# Patient Record
Sex: Female | Born: 1950 | Race: Black or African American | Hispanic: No | Smoking: Former smoker
Health system: Southern US, Community
[De-identification: ages and names within clinical notes are randomized; demographics above are authoritative.]

## PROBLEM LIST (undated history)

## (undated) DIAGNOSIS — I10 Essential (primary) hypertension: Secondary | ICD-10-CM

## (undated) DIAGNOSIS — M1990 Unspecified osteoarthritis, unspecified site: Secondary | ICD-10-CM

## (undated) DIAGNOSIS — R7303 Prediabetes: Secondary | ICD-10-CM

## (undated) DIAGNOSIS — I639 Cerebral infarction, unspecified: Secondary | ICD-10-CM

## (undated) DIAGNOSIS — E119 Type 2 diabetes mellitus without complications: Secondary | ICD-10-CM

## (undated) HISTORY — PX: EYE SURGERY: SHX253

## (undated) HISTORY — PX: ABDOMINAL HYSTERECTOMY: SHX81

---
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
## 2006-09-17 ENCOUNTER — Emergency Department: Payer: Self-pay | Admitting: Emergency Medicine

## 2008-12-20 ENCOUNTER — Ambulatory Visit: Payer: Self-pay | Admitting: Family Medicine

## 2009-02-08 ENCOUNTER — Ambulatory Visit: Payer: Self-pay | Admitting: Gastroenterology

## 2012-04-28 ENCOUNTER — Ambulatory Visit: Payer: Self-pay | Admitting: Family Medicine

## 2013-05-11 ENCOUNTER — Ambulatory Visit: Payer: Self-pay | Admitting: Family Medicine

## 2014-01-05 ENCOUNTER — Ambulatory Visit: Payer: Self-pay | Admitting: Family Medicine

## 2014-01-07 ENCOUNTER — Ambulatory Visit: Payer: Self-pay | Admitting: Family Medicine

## 2014-02-07 ENCOUNTER — Ambulatory Visit: Payer: Self-pay | Admitting: Family Medicine

## 2016-02-20 ENCOUNTER — Other Ambulatory Visit: Payer: Self-pay | Admitting: Family Medicine

## 2016-02-20 DIAGNOSIS — Z1231 Encounter for screening mammogram for malignant neoplasm of breast: Secondary | ICD-10-CM

## 2016-03-07 ENCOUNTER — Ambulatory Visit
Admission: RE | Admit: 2016-03-07 | Discharge: 2016-03-07 | Disposition: A | Discharge status: Home / Self Care | Payer: BC Managed Care – PPO | Source: Ambulatory Visit | Attending: Family Medicine | Admitting: Family Medicine

## 2016-03-07 DIAGNOSIS — Z1231 Encounter for screening mammogram for malignant neoplasm of breast: Secondary | ICD-10-CM | POA: Diagnosis present

## 2017-09-25 ENCOUNTER — Other Ambulatory Visit: Payer: Self-pay | Admitting: Student

## 2017-09-25 DIAGNOSIS — M25562 Pain in left knee: Secondary | ICD-10-CM

## 2017-10-02 ENCOUNTER — Ambulatory Visit
Admission: RE | Admit: 2017-10-02 | Discharge: 2017-10-02 | Disposition: A | Discharge status: Home / Self Care | Payer: BC Managed Care – PPO | Source: Ambulatory Visit | Attending: Student | Admitting: Student

## 2017-10-02 DIAGNOSIS — M1712 Unilateral primary osteoarthritis, left knee: Secondary | ICD-10-CM | POA: Insufficient documentation

## 2017-10-02 DIAGNOSIS — M25562 Pain in left knee: Secondary | ICD-10-CM

## 2017-10-09 ENCOUNTER — Other Ambulatory Visit: Payer: Self-pay | Admitting: Family Medicine

## 2017-10-09 DIAGNOSIS — Z1231 Encounter for screening mammogram for malignant neoplasm of breast: Secondary | ICD-10-CM

## 2017-10-15 ENCOUNTER — Ambulatory Visit
Admission: RE | Admit: 2017-10-15 | Discharge: 2017-10-15 | Disposition: A | Discharge status: Home / Self Care | Payer: Medicare Other | Source: Ambulatory Visit | Attending: Family Medicine | Admitting: Family Medicine

## 2017-10-15 DIAGNOSIS — Z1231 Encounter for screening mammogram for malignant neoplasm of breast: Secondary | ICD-10-CM | POA: Diagnosis present

## 2017-10-22 ENCOUNTER — Encounter
Admission: RE | Admit: 2017-10-22 | Discharge: 2017-10-22 | Disposition: A | Discharge status: Home / Self Care | Payer: Medicare Other | Source: Ambulatory Visit | Attending: Surgery | Admitting: Surgery

## 2017-10-22 ENCOUNTER — Other Ambulatory Visit: Payer: Self-pay

## 2017-10-22 ENCOUNTER — Ambulatory Visit
Admission: RE | Admit: 2017-10-22 | Discharge: 2017-10-22 | Disposition: A | Discharge status: Home / Self Care | Payer: Medicare Other | Source: Ambulatory Visit | Attending: Surgery | Admitting: Surgery

## 2017-10-22 DIAGNOSIS — Z01810 Encounter for preprocedural cardiovascular examination: Secondary | ICD-10-CM | POA: Insufficient documentation

## 2017-10-22 DIAGNOSIS — Z01818 Encounter for other preprocedural examination: Secondary | ICD-10-CM | POA: Diagnosis present

## 2017-10-22 DIAGNOSIS — Z01812 Encounter for preprocedural laboratory examination: Secondary | ICD-10-CM | POA: Insufficient documentation

## 2017-10-22 HISTORY — DX: Cerebral infarction, unspecified: I63.9

## 2017-10-22 HISTORY — DX: Unspecified osteoarthritis, unspecified site: M19.90

## 2017-10-22 HISTORY — DX: Essential (primary) hypertension: I10

## 2017-10-22 LAB — PROTIME-INR
INR: 0.98
PROTHROMBIN TIME: 12.9 s (ref 11.4–15.2)

## 2017-10-22 LAB — CBC
HCT: 36.9 % (ref 35.0–47.0)
HEMOGLOBIN: 12.5 g/dL (ref 12.0–16.0)
MCH: 27.7 pg (ref 26.0–34.0)
MCHC: 33.7 g/dL (ref 32.0–36.0)
MCV: 82.1 fL (ref 80.0–100.0)
Platelets: 251 10*3/uL (ref 150–440)
RBC: 4.5 MIL/uL (ref 3.80–5.20)
RDW: 14.5 % (ref 11.5–14.5)
WBC: 5.9 10*3/uL (ref 3.6–11.0)

## 2017-10-22 LAB — TYPE AND SCREEN
ABO/RH(D): B POS
ANTIBODY SCREEN: NEGATIVE

## 2017-10-22 LAB — SURGICAL PCR SCREEN
MRSA, PCR: NEGATIVE
STAPHYLOCOCCUS AUREUS: NEGATIVE

## 2017-10-22 LAB — BASIC METABOLIC PANEL
Anion gap: 9 (ref 5–15)
BUN: 11 mg/dL (ref 6–20)
CHLORIDE: 107 mmol/L (ref 101–111)
CO2: 26 mmol/L (ref 22–32)
Calcium: 9.1 mg/dL (ref 8.9–10.3)
Creatinine, Ser: 0.78 mg/dL (ref 0.44–1.00)
GFR calc Af Amer: >60 mL/min (ref >60)
GFR calc non Af Amer: >60 mL/min (ref >60)
GLUCOSE: 125 mg/dL — AB (ref 65–99)
POTASSIUM: 3.4 mmol/L — AB (ref 3.5–5.1)
Sodium: 142 mmol/L (ref 135–145)

## 2017-10-22 LAB — URINALYSIS, COMPLETE (UACMP) WITH MICROSCOPIC
BACTERIA UA: NONE SEEN
BILIRUBIN URINE: NEGATIVE
Glucose, UA: NEGATIVE mg/dL
HGB URINE DIPSTICK: NEGATIVE
KETONES UR: NEGATIVE mg/dL
Nitrite: NEGATIVE
PROTEIN: NEGATIVE mg/dL
SPECIFIC GRAVITY, URINE: 1.024 (ref 1.005–1.030)
pH: 5 (ref 5.0–8.0)

## 2017-10-22 NOTE — Patient Instructions (Signed)
Your procedure is scheduled on: Thursday October 30, 2017  Report to Same Day Surgery on the 2nd floor [facility] the [facility]. To find out your arrival time, please call [phone] between 1PM - 3PM on: Wednesday October 29, 2017  REMEMBER: Instructions that are not followed completely may result in serious medical risk, up to and including death; or upon the discretion of your surgeon and anesthesiologist your surgery may need to be rescheduled.  Do not eat food after midnight the night before your procedure.  No gum chewing or hard candies.  You may however, drink CLEAR liquids up to 2 hours before you are scheduled to arrive at the hospital for your procedure.  Do not drink clear liquids within 2 hours of the start of your surgery.  Clear liquids include: - water  - apple juice without pulp - clear gatorade - black coffee or tea (Do NOT add anything to the coffee or tea) Do NOT drink anything that is not on this list.  No Smoking including e-cigarettes for 24 hours prior to surgery. No chewable tobacco products for at least 6 hours prior to surgery. No nicotine patches on the day of surgery.  On the morning of surgery brush your teeth with toothpaste and water, you may rinse your mouth with mouthwash if you wish. Do not swallow any  toothpaste OR mouthwash.  Notify your doctor if there is any change in your medical condition (cold, fever, infection).  Do not wear jewelry, make-up, hairpins, clips or nail polish.  Do not wear lotions, powders, or perfumes. You may NOT wear deodorant.  Do not shave 48 hours prior to surgery. Men may shave face and neck.  Contacts and dentures may not be worn into surgery.  Do not bring valuables to the hospital. [facility] any belongings or valuables.   TAKE THESE MEDICATIONS THE MORNING OF SURGERY WITH A SIP OF WATER: CARVEDILOL   Use CHG Soap or wipes as directed on instruction sheet.  Follow recommendations  from Cardiologist, Pulmonologist or PCP regarding stopping Aspirin, Coumadin, Plavix, Eliquis, Pradaxa, or Pletal.  Stop Anti-inflammatories such as Advil, Aleve, Ibuprofen, Motrin, Naproxen, Naprosyn, Goodie powder, MELOXICAM  or aspirin products. (May take Tylenol or Acetaminophen if needed.)  Stop ANY OVER THE COUNTER supplements until after surgery. (May continue Vitamin D, Vitamin B, and multivitamin.)  If you are being admitted to the hospital overnight, leave your suitcase in the car. After surgery it may be brought to your room.  If you are being discharged the day of surgery, you will not be allowed to drive home. You will need someone to drive you home and stay with you that night.   If you are taking public transportation, you will need to have a responsible adult to with you.  Please call the number above if you have any questions about these instructions.

## 2017-10-22 NOTE — Pre-Procedure Instructions (Signed)
Abnormal urinalysis result sent to Dr Binnie RailPoggi's office

## 2017-10-24 ENCOUNTER — Other Ambulatory Visit: Payer: Medicare Other

## 2017-10-27 ENCOUNTER — Encounter
Admission: RE | Admit: 2017-10-27 | Discharge: 2017-10-27 | Disposition: A | Discharge status: Home / Self Care | Payer: Medicare Other | Source: Ambulatory Visit | Attending: Surgery | Admitting: Surgery

## 2017-10-27 LAB — URINALYSIS, ROUTINE W REFLEX MICROSCOPIC
BILIRUBIN URINE: NEGATIVE
Glucose, UA: NEGATIVE mg/dL
HGB URINE DIPSTICK: NEGATIVE
Ketones, ur: NEGATIVE mg/dL
NITRITE: NEGATIVE
PROTEIN: NEGATIVE mg/dL
Specific Gravity, Urine: 1.01 (ref 1.005–1.030)
pH: 7 (ref 5.0–8.0)

## 2017-10-27 NOTE — Pre-Procedure Instructions (Signed)
Repeat urine collected, sent to lab.

## 2017-10-29 MED ORDER — CEFAZOLIN SODIUM-DEXTROSE 2-4 GM/100ML-% IV SOLN: 2.0000 g | Freq: Once | INTRAVENOUS | Status: DC

## 2017-10-30 ENCOUNTER — Inpatient Hospital Stay
Admission: RE | Admit: 2017-10-30 | Discharge: 2017-11-01 | DRG: 470 | Disposition: A | Discharge status: Home Health | Payer: Medicare Other | Source: Ambulatory Visit | Attending: Surgery | Admitting: Surgery

## 2017-10-30 ENCOUNTER — Inpatient Hospital Stay: Payer: Medicare Other | Admitting: Anesthesiology

## 2017-10-30 ENCOUNTER — Encounter: Payer: Self-pay | Admitting: *Deleted

## 2017-10-30 ENCOUNTER — Encounter
Admission: RE | Disposition: A | Discharge status: Home Health | Payer: Self-pay | Source: Ambulatory Visit | Attending: Surgery

## 2017-10-30 ENCOUNTER — Other Ambulatory Visit: Payer: Self-pay

## 2017-10-30 ENCOUNTER — Inpatient Hospital Stay: Payer: Medicare Other

## 2017-10-30 DIAGNOSIS — M1612 Unilateral primary osteoarthritis, left hip: Principal | ICD-10-CM | POA: Diagnosis present

## 2017-10-30 DIAGNOSIS — Z79899 Other long term (current) drug therapy: Secondary | ICD-10-CM | POA: Diagnosis not present

## 2017-10-30 DIAGNOSIS — Z7982 Long term (current) use of aspirin: Secondary | ICD-10-CM | POA: Diagnosis not present

## 2017-10-30 DIAGNOSIS — R509 Fever, unspecified: Secondary | ICD-10-CM | POA: Diagnosis not present

## 2017-10-30 DIAGNOSIS — Z87891 Personal history of nicotine dependence: Secondary | ICD-10-CM

## 2017-10-30 DIAGNOSIS — Z791 Long term (current) use of non-steroidal anti-inflammatories (NSAID): Secondary | ICD-10-CM

## 2017-10-30 DIAGNOSIS — Z96642 Presence of left artificial hip joint: Secondary | ICD-10-CM

## 2017-10-30 DIAGNOSIS — I69398 Other sequelae of cerebral infarction: Secondary | ICD-10-CM

## 2017-10-30 DIAGNOSIS — I10 Essential (primary) hypertension: Secondary | ICD-10-CM | POA: Diagnosis present

## 2017-10-30 DIAGNOSIS — I38 Endocarditis, valve unspecified: Secondary | ICD-10-CM | POA: Diagnosis present

## 2017-10-30 HISTORY — PX: TOTAL HIP ARTHROPLASTY: SHX124

## 2017-10-30 LAB — ABO/RH: ABO/RH(D): B POS

## 2017-10-30 LAB — GLUCOSE, CAPILLARY: GLUCOSE-CAPILLARY: 125 mg/dL — AB (ref 65–99)

## 2017-10-30 SURGERY — ARTHROPLASTY, HIP, TOTAL,POSTERIOR APPROACH
Anesthesia: Spinal | Site: Hip | Laterality: Left | Wound class: Clean

## 2017-10-30 MED ORDER — PANTOPRAZOLE SODIUM 40 MG PO TBEC
40.0000 mg | DELAYED_RELEASE_TABLET | Freq: Every day | ORAL | Status: DC
  Administered 2017-10-30 – 2017-11-01 (×3): 40 mg via ORAL
  Filled 2017-10-30 (×2): qty 1

## 2017-10-30 MED ORDER — NEOMYCIN-POLYMYXIN B GU 40-200000 IR SOLN
Status: DC | PRN
  Administered 2017-10-30: 14 mL

## 2017-10-30 MED ORDER — BUPIVACAINE LIPOSOME 1.3 % IJ SUSP
INTRAMUSCULAR | Status: AC
  Filled 2017-10-30: qty 20

## 2017-10-30 MED ORDER — DIPHENHYDRAMINE HCL 12.5 MG/5ML PO ELIX: 12.5000 mg | ORAL_SOLUTION | ORAL | Status: DC | PRN

## 2017-10-30 MED ORDER — HYDROCODONE-ACETAMINOPHEN 7.5-325 MG PO TABS: 1.0000 | ORAL_TABLET | Freq: Once | ORAL | Status: DC | PRN

## 2017-10-30 MED ORDER — ACETAMINOPHEN 500 MG PO TABS
1000.0000 mg | ORAL_TABLET | Freq: Four times a day (QID) | ORAL | Status: AC
  Administered 2017-10-30 – 2017-10-31 (×4): 1000 mg via ORAL
  Filled 2017-10-30 (×4): qty 2

## 2017-10-30 MED ORDER — OXYCODONE HCL 5 MG PO TABS
10.0000 mg | ORAL_TABLET | ORAL | Status: DC | PRN
  Administered 2017-10-31: 10 mg via ORAL
  Filled 2017-10-30: qty 2

## 2017-10-30 MED ORDER — DOCUSATE SODIUM 100 MG PO CAPS
100.0000 mg | ORAL_CAPSULE | Freq: Two times a day (BID) | ORAL | Status: DC
  Administered 2017-10-30 – 2017-11-01 (×4): 100 mg via ORAL
  Filled 2017-10-30 (×4): qty 1

## 2017-10-30 MED ORDER — SODIUM CHLORIDE 0.9 % IV SOLN
INTRAVENOUS | Status: DC | PRN
  Administered 2017-10-30: 60 mL

## 2017-10-30 MED ORDER — CARVEDILOL 3.125 MG PO TABS
3.1250 mg | ORAL_TABLET | Freq: Two times a day (BID) | ORAL | Status: DC
  Administered 2017-10-30 – 2017-11-01 (×4): 3.125 mg via ORAL
  Filled 2017-10-30 (×4): qty 1

## 2017-10-30 MED ORDER — CEFAZOLIN SODIUM-DEXTROSE 2-4 GM/100ML-% IV SOLN
INTRAVENOUS | Status: AC
  Filled 2017-10-30: qty 100

## 2017-10-30 MED ORDER — PHENYLEPHRINE HCL 10 MG/ML IJ SOLN
INTRAVENOUS | Status: DC | PRN
  Administered 2017-10-30: 20 ug/min via INTRAVENOUS

## 2017-10-30 MED ORDER — PHENYLEPHRINE HCL 10 MG/ML IJ SOLN
INTRAMUSCULAR | Status: AC
  Filled 2017-10-30: qty 1

## 2017-10-30 MED ORDER — MORPHINE SULFATE (PF) 2 MG/ML IV SOLN: 2.0000 mg | INTRAVENOUS | Status: DC | PRN

## 2017-10-30 MED ORDER — OXYCODONE HCL 5 MG PO TABS
5.0000 mg | ORAL_TABLET | ORAL | Status: DC | PRN
  Administered 2017-10-30 – 2017-10-31 (×4): 5 mg via ORAL
  Filled 2017-10-30 (×4): qty 1

## 2017-10-30 MED ORDER — FENTANYL CITRATE (PF) 100 MCG/2ML IJ SOLN
INTRAMUSCULAR | Status: AC
  Filled 2017-10-30: qty 2

## 2017-10-30 MED ORDER — AMLODIPINE BESYLATE 10 MG PO TABS
10.0000 mg | ORAL_TABLET | Freq: Every evening | ORAL | Status: DC
  Administered 2017-10-30 – 2017-11-01 (×2): 10 mg via ORAL
  Filled 2017-10-30 (×2): qty 1

## 2017-10-30 MED ORDER — METOCLOPRAMIDE HCL 5 MG/ML IJ SOLN: 5.0000 mg | Freq: Three times a day (TID) | INTRAMUSCULAR | Status: DC | PRN

## 2017-10-30 MED ORDER — LIDOCAINE HCL (PF) 2 % IJ SOLN
INTRAMUSCULAR | Status: AC
  Filled 2017-10-30: qty 10

## 2017-10-30 MED ORDER — BUPIVACAINE-EPINEPHRINE (PF) 0.5% -1:200000 IJ SOLN
INTRAMUSCULAR | Status: AC
  Filled 2017-10-30: qty 30

## 2017-10-30 MED ORDER — ENOXAPARIN SODIUM 40 MG/0.4ML ~~LOC~~ SOLN
40.0000 mg | SUBCUTANEOUS | Status: DC
  Administered 2017-10-31 – 2017-11-01 (×2): 40 mg via SUBCUTANEOUS
  Filled 2017-10-30 (×2): qty 0.4

## 2017-10-30 MED ORDER — ONDANSETRON HCL 4 MG PO TABS: 4.0000 mg | ORAL_TABLET | Freq: Four times a day (QID) | ORAL | Status: DC | PRN

## 2017-10-30 MED ORDER — ACETAMINOPHEN 160 MG/5ML PO SOLN
325.0000 mg | ORAL | Status: DC | PRN
  Filled 2017-10-30: qty 20.3

## 2017-10-30 MED ORDER — MIDAZOLAM HCL 5 MG/5ML IJ SOLN
INTRAMUSCULAR | Status: DC | PRN
  Administered 2017-10-30 (×2): 1 mg via INTRAVENOUS

## 2017-10-30 MED ORDER — FLEET ENEMA 7-19 GM/118ML RE ENEM: 1.0000 | ENEMA | Freq: Once | RECTAL | Status: DC | PRN

## 2017-10-30 MED ORDER — FAMOTIDINE 20 MG PO TABS
20.0000 mg | ORAL_TABLET | Freq: Once | ORAL | Status: AC
  Administered 2017-10-30: 20 mg via ORAL

## 2017-10-30 MED ORDER — FENTANYL CITRATE (PF) 100 MCG/2ML IJ SOLN
INTRAMUSCULAR | Status: DC | PRN
  Administered 2017-10-30 (×2): 50 ug via INTRAVENOUS

## 2017-10-30 MED ORDER — DEXTROSE-NACL 5-0.45 % IV SOLN: INTRAVENOUS | Status: DC

## 2017-10-30 MED ORDER — ONDANSETRON HCL 4 MG/2ML IJ SOLN: 4.0000 mg | Freq: Four times a day (QID) | INTRAMUSCULAR | Status: DC | PRN

## 2017-10-30 MED ORDER — CEFAZOLIN SODIUM-DEXTROSE 2-4 GM/100ML-% IV SOLN
2.0000 g | Freq: Four times a day (QID) | INTRAVENOUS | Status: AC
  Administered 2017-10-30 – 2017-10-31 (×3): 2 g via INTRAVENOUS
  Filled 2017-10-30 (×3): qty 100

## 2017-10-30 MED ORDER — ONDANSETRON HCL 4 MG/2ML IJ SOLN
INTRAMUSCULAR | Status: AC
  Filled 2017-10-30: qty 2

## 2017-10-30 MED ORDER — GLYCOPYRROLATE 0.2 MG/ML IJ SOLN
INTRAMUSCULAR | Status: DC | PRN
  Administered 2017-10-30: 0.2 mg via INTRAVENOUS

## 2017-10-30 MED ORDER — CEFAZOLIN SODIUM-DEXTROSE 2-3 GM-%(50ML) IV SOLR
INTRAVENOUS | Status: DC | PRN
  Administered 2017-10-30: 2 g via INTRAVENOUS

## 2017-10-30 MED ORDER — PROPOFOL 500 MG/50ML IV EMUL
INTRAVENOUS | Status: AC
  Filled 2017-10-30: qty 50

## 2017-10-30 MED ORDER — ACETAMINOPHEN 650 MG RE SUPP: 650.0000 mg | RECTAL | Status: DC | PRN

## 2017-10-30 MED ORDER — TRANEXAMIC ACID 1000 MG/10ML IV SOLN
INTRAVENOUS | Status: AC
  Filled 2017-10-30: qty 10

## 2017-10-30 MED ORDER — TRANEXAMIC ACID 1000 MG/10ML IV SOLN
INTRAVENOUS | Status: AC | PRN
  Administered 2017-10-30: 1000 mg via INTRAVENOUS

## 2017-10-30 MED ORDER — ACETAMINOPHEN 325 MG PO TABS: 325.0000 mg | ORAL_TABLET | ORAL | Status: DC | PRN

## 2017-10-30 MED ORDER — ONDANSETRON HCL 4 MG/2ML IJ SOLN
INTRAMUSCULAR | Status: DC | PRN
Start: 2017-10-30 — End: 2017-10-30
  Administered 2017-10-30: 4 mg via INTRAVENOUS

## 2017-10-30 MED ORDER — PHENYLEPHRINE HCL 10 MG/ML IJ SOLN
INTRAMUSCULAR | Status: DC | PRN
  Administered 2017-10-30: 100 ug via INTRAVENOUS

## 2017-10-30 MED ORDER — PROPOFOL 500 MG/50ML IV EMUL
INTRAVENOUS | Status: DC | PRN
  Administered 2017-10-30: 50 ug/kg/min via INTRAVENOUS

## 2017-10-30 MED ORDER — STERILE WATER FOR INJECTION IJ SOLN
INTRAMUSCULAR | Status: AC
  Filled 2017-10-30: qty 10

## 2017-10-30 MED ORDER — KCL IN DEXTROSE-NACL 20-5-0.45 MEQ/L-%-% IV SOLN
INTRAVENOUS | Status: DC
  Administered 2017-10-30 – 2017-10-31 (×2): via INTRAVENOUS
  Filled 2017-10-30 (×6): qty 1000

## 2017-10-30 MED ORDER — ASPIRIN-ACETAMINOPHEN-CAFFEINE 250-250-65 MG PO TABS
2.0000 | ORAL_TABLET | Freq: Every day | ORAL | Status: DC | PRN
  Filled 2017-10-30: qty 2

## 2017-10-30 MED ORDER — ACETAMINOPHEN 10 MG/ML IV SOLN
INTRAVENOUS | Status: DC | PRN
Start: 2017-10-30 — End: 2017-10-30
  Administered 2017-10-30: 1000 mg via INTRAVENOUS

## 2017-10-30 MED ORDER — KETOROLAC TROMETHAMINE 15 MG/ML IJ SOLN: 30.0000 mg | Freq: Once | INTRAMUSCULAR | Status: DC

## 2017-10-30 MED ORDER — HYDROMORPHONE HCL 1 MG/ML IJ SOLN: 0.2500 mg | INTRAMUSCULAR | Status: DC | PRN

## 2017-10-30 MED ORDER — BUPIVACAINE HCL (PF) 0.5 % IJ SOLN
INTRAMUSCULAR | Status: DC | PRN
  Administered 2017-10-30: 2.5 mL

## 2017-10-30 MED ORDER — MIDAZOLAM HCL 2 MG/2ML IJ SOLN
INTRAMUSCULAR | Status: AC
  Filled 2017-10-30: qty 2

## 2017-10-30 MED ORDER — METOCLOPRAMIDE HCL 10 MG PO TABS: 5.0000 mg | ORAL_TABLET | Freq: Three times a day (TID) | ORAL | Status: DC | PRN

## 2017-10-30 MED ORDER — BISACODYL 10 MG RE SUPP
10.0000 mg | Freq: Every day | RECTAL | Status: DC | PRN
  Administered 2017-11-01: 10 mg via RECTAL
  Filled 2017-10-30: qty 1

## 2017-10-30 MED ORDER — NEOMYCIN-POLYMYXIN B GU 40-200000 IR SOLN
Status: AC
  Filled 2017-10-30: qty 20

## 2017-10-30 MED ORDER — BUPIVACAINE-EPINEPHRINE (PF) 0.5% -1:200000 IJ SOLN
INTRAMUSCULAR | Status: DC | PRN
  Administered 2017-10-30: 30 mL via PERINEURAL

## 2017-10-30 MED ORDER — GLYCOPYRROLATE 0.2 MG/ML IJ SOLN
INTRAMUSCULAR | Status: AC
  Filled 2017-10-30: qty 1

## 2017-10-30 MED ORDER — MAGNESIUM HYDROXIDE 400 MG/5ML PO SUSP
30.0000 mL | Freq: Every day | ORAL | Status: DC | PRN
  Administered 2017-11-01: 30 mL via ORAL
  Filled 2017-10-30: qty 30

## 2017-10-30 MED ORDER — ACETAMINOPHEN 325 MG PO TABS
650.0000 mg | ORAL_TABLET | ORAL | Status: DC | PRN
  Administered 2017-11-01: 650 mg via ORAL
  Filled 2017-10-30: qty 2

## 2017-10-30 MED ORDER — ACETAMINOPHEN 10 MG/ML IV SOLN
INTRAVENOUS | Status: AC
  Filled 2017-10-30: qty 100

## 2017-10-30 MED ORDER — LACTATED RINGERS IV SOLN
INTRAVENOUS | Status: DC
  Administered 2017-10-30 (×3): via INTRAVENOUS

## 2017-10-30 MED ORDER — FAMOTIDINE 20 MG PO TABS
ORAL_TABLET | ORAL | Status: AC
  Filled 2017-10-30: qty 1

## 2017-10-30 MED ORDER — SODIUM CHLORIDE 0.9 % IJ SOLN
INTRAMUSCULAR | Status: AC
  Filled 2017-10-30: qty 50

## 2017-10-30 MED ORDER — ATORVASTATIN CALCIUM 20 MG PO TABS
40.0000 mg | ORAL_TABLET | Freq: Every evening | ORAL | Status: DC
  Administered 2017-10-30 – 2017-11-01 (×3): 40 mg via ORAL
  Filled 2017-10-30 (×3): qty 2

## 2017-10-30 MED ORDER — KETOROLAC TROMETHAMINE 15 MG/ML IJ SOLN
15.0000 mg | Freq: Four times a day (QID) | INTRAMUSCULAR | Status: AC
  Administered 2017-10-30 – 2017-10-31 (×4): 15 mg via INTRAVENOUS
  Filled 2017-10-30 (×4): qty 1

## 2017-10-30 MED ORDER — PROMETHAZINE HCL 25 MG/ML IJ SOLN: 6.2500 mg | INTRAMUSCULAR | Status: DC | PRN

## 2017-10-30 MED ORDER — LISINOPRIL 20 MG PO TABS
40.0000 mg | ORAL_TABLET | Freq: Every evening | ORAL | Status: DC
  Administered 2017-10-30 – 2017-11-01 (×2): 40 mg via ORAL
  Filled 2017-10-30 (×2): qty 2

## 2017-10-30 SURGICAL SUPPLY — 58 items
BAG DECANTER FOR FLEXI CONT (MISCELLANEOUS) IMPLANT
BLADE SAGITTAL WIDE XTHICK NO (BLADE) ×3 IMPLANT
BLADE SURG SZ20 CARB STEEL (BLADE) ×3 IMPLANT
CANISTER SUCT 1200ML W/VALVE (MISCELLANEOUS) ×3 IMPLANT
CANISTER SUCT 3000ML PPV (MISCELLANEOUS) ×6 IMPLANT
CAPT HIP TOTAL 2 ×3 IMPLANT
CHLORAPREP W/TINT 26ML (MISCELLANEOUS) ×3 IMPLANT
DRAPE IMP U-DRAPE 54X76 (DRAPES) ×3 IMPLANT
DRAPE INCISE IOBAN 66X60 STRL (DRAPES) ×3 IMPLANT
DRAPE SHEET LG 3/4 BI-LAMINATE (DRAPES) ×3 IMPLANT
DRAPE SURG 17X11 SM STRL (DRAPES) ×6 IMPLANT
DRAPE TABLE BACK 80X90 (DRAPES) ×3 IMPLANT
DRSG OPSITE POSTOP 4X10 (GAUZE/BANDAGES/DRESSINGS) ×3 IMPLANT
DRSG OPSITE POSTOP 4X12 (GAUZE/BANDAGES/DRESSINGS) ×3 IMPLANT
DRSG OPSITE POSTOP 4X14 (GAUZE/BANDAGES/DRESSINGS) IMPLANT
ELECT BLADE 6.5 EXT (BLADE) ×3 IMPLANT
ELECT CAUTERY BLADE 6.4 (BLADE) ×3 IMPLANT
GAUZE PACK 2X3YD (MISCELLANEOUS) ×3 IMPLANT
GLOVE BIO SURGEON STRL SZ7.5 (GLOVE) ×12 IMPLANT
GLOVE BIO SURGEON STRL SZ8 (GLOVE) ×12 IMPLANT
GLOVE BIOGEL PI IND STRL 8 (GLOVE) ×1 IMPLANT
GLOVE BIOGEL PI INDICATOR 8 (GLOVE) ×2
GLOVE INDICATOR 8.0 STRL GRN (GLOVE) ×3 IMPLANT
GOWN STRL REUS W/ TWL LRG LVL3 (GOWN DISPOSABLE) ×1 IMPLANT
GOWN STRL REUS W/ TWL XL LVL3 (GOWN DISPOSABLE) ×1 IMPLANT
GOWN STRL REUS W/TWL LRG LVL3 (GOWN DISPOSABLE) ×2
GOWN STRL REUS W/TWL XL LVL3 (GOWN DISPOSABLE) ×2
HOOD PEEL AWAY FLYTE STAYCOOL (MISCELLANEOUS) ×9 IMPLANT
IV NS 100ML SINGLE PACK (IV SOLUTION) ×3 IMPLANT
KIT TURNOVER KIT A (KITS) ×3 IMPLANT
NDL SAFETY ECLIPSE 18X1.5 (NEEDLE) ×2 IMPLANT
NEEDLE FILTER BLUNT 18X 1/2SAF (NEEDLE) ×2
NEEDLE FILTER BLUNT 18X1 1/2 (NEEDLE) ×1 IMPLANT
NEEDLE HYPO 18GX1.5 SHARP (NEEDLE) ×4
NEEDLE SPNL 20GX3.5 QUINCKE YW (NEEDLE) ×3 IMPLANT
NS IRRIG 1000ML POUR BTL (IV SOLUTION) ×3 IMPLANT
PACK HIP PROSTHESIS (MISCELLANEOUS) ×3 IMPLANT
PILLOW ABDUC SM (MISCELLANEOUS) ×3 IMPLANT
PIN STEINMANN 3/16X9 BAY 6PK (Pin) ×1 IMPLANT
PULSAVAC PLUS IRRIG FAN TIP (DISPOSABLE) ×3
SOL .9 NS 3000ML IRR  AL (IV SOLUTION) ×2
SOL .9 NS 3000ML IRR UROMATIC (IV SOLUTION) ×1 IMPLANT
SPONGE LAP 18X18 5 PK (GAUZE/BANDAGES/DRESSINGS) IMPLANT
ST PIN 3/16X9 BAY 6PK (Pin) ×3 IMPLANT
STAPLER SKIN PROX 35W (STAPLE) ×3 IMPLANT
SUT TICRON 2-0 30IN 311381 (SUTURE) ×9 IMPLANT
SUT VIC AB 0 CT1 36 (SUTURE) ×3 IMPLANT
SUT VIC AB 1 CT1 36 (SUTURE) ×6 IMPLANT
SUT VIC AB 2-0 CT1 (SUTURE) ×12 IMPLANT
SYR 10ML LL (SYRINGE) ×3 IMPLANT
SYR 20CC LL (SYRINGE) ×3 IMPLANT
SYR 30ML LL (SYRINGE) ×9 IMPLANT
SYR TB 1ML 27GX1/2 LL (SYRINGE) IMPLANT
TAPE TRANSPORE STRL 2 31045 (GAUZE/BANDAGES/DRESSINGS) ×3 IMPLANT
TIP BRUSH PULSAVAC PLUS 24.33 (MISCELLANEOUS) IMPLANT
TIP FAN IRRIG PULSAVAC PLUS (DISPOSABLE) ×1 IMPLANT
TRAY FOLEY W/METER SILVER 16FR (SET/KITS/TRAYS/PACK) ×3 IMPLANT
WATER STERILE IRR 1000ML POUR (IV SOLUTION) ×3 IMPLANT

## 2017-10-30 NOTE — Evaluation (Signed)
Physical Therapy Evaluation Patient Details Name: Abigail Tran MRN: [id number] DOB: 1950/10/23 Today's Date: 10/30/2017   History of Present Illness  67 y/o female s/p R total hip replacement 2/21.  Clinical Impression  Pt did very well with POD0 PT exam showing good bed mobility/transfers w/o assist, walking 25 ft w/o issue, showing ability to do SLRs and generally did well with ~13 minutes of LE exercises.  Pt very motivated to do all she can and family very supportive.  Per today's performance pt should be able to go home w/ HHPT and will need a walker.     Follow Up Recommendations Follow surgeon's recommendation for DC plan and follow-up therapies;Home health PT    Equipment Recommendations  Rolling walker with 5" wheels    Recommendations for Other Services       Precautions / Restrictions Precautions Precautions: Posterior Hip Restrictions RLE Weight Bearing: Weight bearing as tolerated      Mobility  Bed Mobility Overal bed mobility: Modified Independent             General bed mobility comments: Pt needed only light cuing to get to EOB w/o phyiscal assist  Transfers Overall transfer level: Modified independent Equipment used: Rolling walker (2 wheeled)             General transfer comment: Again light cuing needed for set up, but otherwise pt did well easily rising to standing and controlling descent to recliner  Ambulation/Gait Ambulation/Gait assistance: Supervision Ambulation Distance (Feet): 25 Feet Assistive device: Rolling walker (2 wheeled)       General Gait Details: Pt walked with good overall confidence with slow but steady gait.  She appeared to tolerate WBing though L LE well and generally had no safety isseus.   Stairs            Wheelchair Mobility    Modified Rankin (Stroke Patients Only)       Balance Overall balance assessment: Modified Independent                                            Pertinent Vitals/Pain Pain Assessment: 0-10 Pain Score: 2     Home Living Family/patient expects to be discharged to:: Private residence Living Arrangements: (reports there is always someone there) Available Help at Discharge: Family   Home Access: Stairs to enter   Entrance Stairs-Number of Steps: 1   Home Equipment: None      Prior Function Level of Independence: Independent         Comments: Pt has been able to stay active with gradually increasing hip/knee pain     Hand Dominance        Extremity/Trunk Assessment   Upper Extremity Assessment Upper Extremity Assessment: Overall WFL for tasks assessed    Lower Extremity Assessment Lower Extremity Assessment: Overall WFL for tasks assessed(expected L LE post-op weakness)       Communication   Communication: No difficulties  Cognition Arousal/Alertness: Awake/alert Behavior During Therapy: WFL for tasks assessed/performed Overall Cognitive Status: Within Functional Limits for tasks assessed                                        General Comments      Exercises Total Joint Exercises Ankle Circles/Pumps: AROM;10 reps Quad Sets:  Strengthening;10 reps Gluteal Sets: Strengthening;10 reps Short Arc Quad: Strengthening;10 reps Heel Slides: AROM;5 reps Hip ABduction/ADduction: AROM;5 reps Straight Leg Raises: AROM;10 reps   Assessment/Plan    PT Assessment Patient needs continued PT services  PT Problem List Decreased strength;Decreased range of motion;Decreased activity tolerance;Decreased balance;Decreased mobility;Decreased coordination;Decreased knowledge of use of DME;Decreased safety awareness;Decreased knowledge of precautions;Pain       PT Treatment Interventions DME instruction;Gait training;Stair training;Functional mobility training;Therapeutic activities;Therapeutic exercise;Balance training;Neuromuscular re-education;Patient/family education    PT Goals (Current goals  can be found in the Care Plan section)  Acute Rehab PT Goals Patient Stated Goal: go home PT Goal Formulation: With patient Time For Goal Achievement: 11/13/17 Potential to Achieve Goals: Good    Frequency BID   Barriers to discharge        Co-evaluation               AM-PAC PT "6 Clicks" Daily Activity  Outcome Measure Difficulty turning over in bed (including adjusting bedclothes, sheets and blankets)?: A Little Difficulty moving from lying on back to sitting on the side of the bed? : A Little Difficulty sitting down on and standing up from a chair with arms (e.g., wheelchair, bedside commode, etc,.)?: A Little Help needed moving to and from a bed to chair (including a wheelchair)?: A Little Help needed walking in hospital room?: A Little Help needed climbing 3-5 steps with a railing? : A Lot 6 Click Score: 17    End of Session Equipment Utilized During Treatment: Gait belt Activity Tolerance: Patient tolerated treatment well Patient left: with chair alarm set;with call bell/phone within reach;with family/visitor present Nurse Communication: Mobility status PT Visit Diagnosis: Muscle weakness (generalized) (M62.81);Difficulty in walking, not elsewhere classified (R26.2)    Time: 9147-8295 PT Time Calculation (min) (ACUTE ONLY): 29 min   Charges:   PT Evaluation $PT Eval Low Complexity: 1 Low PT Treatments $Therapeutic Exercise: 8-22 mins   PT G Codes:        Malachi Pro, DPT 10/30/2017, 5:21 PM

## 2017-10-30 NOTE — Transfer of Care (Signed)
Immediate Anesthesia Transfer of Care Note  Patient: Abigail Tran T Ohern  Procedure(s) Performed: TOTAL HIP ARTHROPLASTY (Left Hip)  Patient Location: PACU  Anesthesia Type:Spinal  Level of Consciousness: awake, alert  and oriented  Airway & Oxygen Therapy: Patient Spontanous Breathing  Post-op Assessment: Report given to RN and Post -op Vital signs reviewed and stable  Post vital signs: Reviewed and stable  Last Vitals:  Vitals:   10/30/17 0612  BP: (!) 159/102  Resp: 12  Temp: 36.5 C  SpO2: 100%    Last Pain:  Vitals:   10/30/17 0612  TempSrc: Tympanic  PainSc: 8          Complications: No apparent anesthesia complications

## 2017-10-30 NOTE — H&P (Signed)
Paper H&P to be scanned into permanent record. H&P reviewed and patient re-examined. No changes. 

## 2017-10-30 NOTE — Anesthesia Preprocedure Evaluation (Addendum)
Anesthesia Evaluation  Patient identified by MRN, date of birth, ID band Patient awake    Reviewed: Allergy & Precautions, H&P , NPO status , reviewed documented beta blocker date and time   Airway Mallampati: II  TM Distance: >3 FB     Dental  (+) Lower Dentures, Upper Dentures   Pulmonary former smoker,    Pulmonary exam normal        Cardiovascular hypertension, Normal cardiovascular exam     Neuro/Psych CVA, Residual Symptoms    GI/Hepatic   Endo/Other  diabetes  Renal/GU      Musculoskeletal   Abdominal   Peds  Hematology   Anesthesia Other Findings Mouth/lip residual from stroke  Reproductive/Obstetrics                             Anesthesia Physical Anesthesia Plan  ASA: II  Anesthesia Plan: Spinal   Post-op Pain Management:    Induction:   PONV Risk Score and Plan: 3 and Propofol infusion  Airway Management Planned:   Additional Equipment:   Intra-op Plan:   Post-operative Plan:   Informed Consent: I have reviewed the patients History and Physical, chart, labs and discussed the procedure including the risks, benefits and alternatives for the proposed anesthesia with the patient or authorized representative who has indicated his/her understanding and acceptance.   Dental Advisory Given  Plan Discussed with: CRNA  Anesthesia Plan Comments:         Anesthesia Quick Evaluation

## 2017-10-30 NOTE — Progress Notes (Signed)
Patient was admitted to room 156 from OR. A&O x4. Daughter at bedside. ABD pillow in place. IV fluids infusing. Bed alarm on for safety. Dressing to hip is CD&I. No c/o pain at this time. Reviewed POC and orders. Allowed time for questions.

## 2017-10-30 NOTE — Anesthesia Post-op Follow-up Note (Signed)
Anesthesia QCDR form completed.        

## 2017-10-30 NOTE — Op Note (Signed)
10/30/2017  10:10 AM  Patient:   Abigail Tran  Pre-Op Diagnosis:   Degenerative joint disease, left hip.  Post-Op Diagnosis:   Same.  Procedure:   Left total hip arthroplasty.  Surgeon:   Maryagnes Amos, MD  Assistant:   Horris Latino, PA-C  Anesthesia:   Spinal  Findings:   As above.  Complications:   None  EBL:   200 cc  Fluids:   1000 cc crystalloid  UOP:   150 cc  TT:   None  Drains:   None  Closure:   Staples  Implants:   Biomet press-fit system with a #9 laterally offset Echo femoral stem, a 52 mm acetabular shell with an E-poly hi-wall liner, and a 36 mm ceramic head with a +0 mm neck adapter.  Brief Clinical Note:   The patient is a 67 year old female with a long history of gradually worsening left hip pain.  Her symptoms have progressed despite medications, activity modification, etc.  Her history and examination are consistent with advanced degenerative joint disease of the left hip, confirmed by plain radiographs.  She presents at this time for a left total hip arthroplasty.   Procedure:   The patient was brought into the operating room. After adequate spinal anesthesia was obtained, the patient was repositioned in the right lateral decubitus position and secured using a lateral hip positioner. The left hip and lower extremity were prepped with ChloroPrep solution before being draped sterilely. Preoperative antibiotics were administered. A timeout was performed to verify the appropriate surgical site before a standard posterior approach to the hip was made through an approximately 5-6 inch incision. The incision was carried down through the subcutaneous tissues to expose the gluteal fascia and proximal end of the iliotibial band. These structures were split the length of the incision and the Charnley self-retaining hip retractor placed. The bursal tissues were swept posteriorly to expose the short external rotators. The anterior border of the piriformis tendon was  identified and this plane developed down through the capsule to enter the joint. A flap of tissue was elevated off the posterior aspect of the femoral neck and greater trochanter and retracted posteriorly. This flap included the piriformis tendon, the short external rotators, and the posterior capsule. The soft tissues were elevated off the lateral aspect of the ilium and a large Steinmann pin placed bicortically. With the left leg aligned over the right, a drill bit was placed into the greater trochanter parallel to the Steinmann pin and the distance between these two pins measured in order to optimize leg lengths postoperatively. The drill bit was removed and the hip dislocated. The piriformis fossa was debrided of soft tissues before the intramedullary canal was accessed through this point using a triple step reamer. The canal was reamed sequentially beginning with a #7 tapered reamer and progressing to a #11 tapered reamer. This provided excellent circumferential chatter. Using the appropriate guide, a femoral neck cut was made 10-12 mm above the lesser trochanter. The femoral head was removed.  Attention was directed to the acetabular side. The labrum was debrided circumferentially before the ligamentum teres was removed using a large curette. A line was drawn on the drapes corresponding to the native version of the acetabulum. This line was used as a guide while the acetabulum was reamed sequentially beginning with a 45 mm reamer and progressing to a 51 mm reamer. This provided excellent circumferential chatter. The 52 mm trial acetabulum was positioned and found to fit quite well.  Therefore, the 52 mm acetabular shell was selected and impacted into place with care taken to maintain the appropriate version. The trial high wall liner was inserted.  Attention was redirected to the femoral side. A box osteotome was used to establish version before the canal was broached sequentially beginning with a #7 broach  and progressing to a #9 broach.  This demonstrated excellent proximal fill, so it was felt best not to proceed with larger broaches.  The #9 broach was left in place and several trial reductions performed using both a standard and laterally offset neck options, as well as the -6 mm and +0 mm neck lengths. The permanent E-polyethylene hi-wall liner was impacted into the acetabular shell and its locking mechanism verified using a quarter-inch osteotome. Next, the ##9 lateral offset femoral stem was impacted into place with care taken to maintain the appropriate version. A repeat trial reduction was performed using the +0 mm neck length. The +0 mm neck length demonstrated excellent stability both in extension and external rotation as well as with flexion to 90 and internal rotation beyond 70. It also was stable in the position of sleep. In addition, leg lengths appeared to be restored appropriately, both by reassessing the position of the right leg over the left, as well as by measuring the distance between the Steinmann pin and the drill bit. The 36 mm ceramic head with the +0 mm neck adapter construct was put together on the back table before being impacted onto the stem of the femoral component. The Morse taper locking mechanism was verified using manual distraction before the head was relocated and placed through a range of motion with the findings as described above.  The wound was copiously irrigated with bacitracin saline solution via the jet lavage system before the peri-incisional and pericapsular tissues were injected with 30 cc of 0.5% Sensorcaine with epinephrine and 20 cc of Exparel diluted out to 60 cc with normal saline to help with postoperative analgesia. The posterior flap was reapproximated to the posterior aspect of the greater trochanter using #2 Tycron interrupted sutures placed through drill holes. Several additional #2 Tycron interrupted sutures were used to reinforce this layer of closure.  The iliotibial band was reapproximated using #1 Vicryl interrupted sutures before the gluteal fascia was closed using a running #1 Vicryl suture. At this point, 1 g of transexemic acid in 10 cc of normal saline was injected into the joint to help reduce postoperative bleeding. The subcutaneous tissues were closed in several layers using 2-0 Vicryl interrupted sutures before the skin was closed using staples. A sterile occlusive dressing was applied to the wound before the patient was placed into an abduction wedge pillow. The patient was then rolled back into the supine position on his/her hospital bed before being awakened and returned to the recovery room in satisfactory condition after tolerating the procedure well.

## 2017-10-30 NOTE — Anesthesia Postprocedure Evaluation (Signed)
Anesthesia Post Note  Patient: Abigail Tran  Procedure(s) Performed: TOTAL HIP ARTHROPLASTY (Left Hip)  Patient location during evaluation: PACU Anesthesia Type: Spinal Level of consciousness: oriented and awake and alert Pain management: pain level controlled Vital Signs Assessment: post-procedure vital signs reviewed and stable Respiratory status: spontaneous breathing and respiratory function stable Cardiovascular status: blood pressure returned to baseline and stable Postop Assessment: no headache, no backache and no apparent nausea or vomiting Anesthetic complications: no     Last Vitals:  Vitals:   10/30/17 1022 10/30/17 1037  BP: 129/73 133/69  Pulse: 60 (!) 55  Resp: 11 16  Temp:    SpO2: 100% 96%    Last Pain:  Vitals:   10/30/17 1007  TempSrc:   PainSc: 0-No pain                 Christia ReadingScott T Ashrita Chrismer

## 2017-10-30 NOTE — Anesthesia Procedure Notes (Signed)
Spinal  Patient location during procedure: OR Preanesthetic Checklist Completed: patient identified, site marked, surgical consent, pre-op evaluation, timeout performed, IV checked, risks and benefits discussed and monitors and equipment checked Spinal Block Patient position: right lateral decubitus Prep: Betadine Patient monitoring: heart rate, continuous pulse ox, blood pressure and cardiac monitor Approach: midline Location: L4-5 Injection technique: single-shot Needle Needle type: Whitacre and Introducer  Needle gauge: 24 G Needle length: 9 cm Additional Notes Negative paresthesia. Negative blood return. Positive free-flowing CSF. Expiration date of kit checked and confirmed. Patient tolerated procedure well, without complications.

## 2017-10-31 LAB — CBC WITH DIFFERENTIAL/PLATELET
Basophils Absolute: 0 10*3/uL (ref 0–0.1)
Basophils Relative: 1 %
EOS PCT: 3 %
Eosinophils Absolute: 0.2 10*3/uL (ref 0–0.7)
HEMATOCRIT: 30.4 % — AB (ref 35.0–47.0)
Hemoglobin: 10.2 g/dL — ABNORMAL LOW (ref 12.0–16.0)
LYMPHS ABS: 1.2 10*3/uL (ref 1.0–3.6)
LYMPHS PCT: 18 %
MCH: 27.5 pg (ref 26.0–34.0)
MCHC: 33.6 g/dL (ref 32.0–36.0)
MCV: 81.9 fL (ref 80.0–100.0)
MONO ABS: 0.4 10*3/uL (ref 0.2–0.9)
Monocytes Relative: 6 %
Neutro Abs: 4.8 10*3/uL (ref 1.4–6.5)
Neutrophils Relative %: 72 %
PLATELETS: 262 10*3/uL (ref 150–440)
RBC: 3.71 MIL/uL — ABNORMAL LOW (ref 3.80–5.20)
RDW: 14.3 % (ref 11.5–14.5)
WBC: 6.7 10*3/uL (ref 3.6–11.0)

## 2017-10-31 LAB — BASIC METABOLIC PANEL
Anion gap: 6 (ref 5–15)
BUN: 8 mg/dL (ref 6–20)
CALCIUM: 8.4 mg/dL — AB (ref 8.9–10.3)
CO2: 25 mmol/L (ref 22–32)
Chloride: 109 mmol/L (ref 101–111)
Creatinine, Ser: 0.92 mg/dL (ref 0.44–1.00)
GFR calc Af Amer: >60 mL/min (ref >60)
GLUCOSE: 136 mg/dL — AB (ref 65–99)
POTASSIUM: 3.6 mmol/L (ref 3.5–5.1)
Sodium: 140 mmol/L (ref 135–145)

## 2017-10-31 NOTE — Care Management (Signed)
Pharmacy had patient's old insurance plan on file- updated.  Patient would like to use [facility]- [facility]. [facility] Lovenox $64.00. Patient agrees.

## 2017-10-31 NOTE — Progress Notes (Signed)
Physical Therapy Treatment Patient Details Name: Abigail Tran MRN: [id number] DOB: 03/12/1951 Today's Date: 10/31/2017    History of Present Illness 67 y/o female s/p R total hip replacement 2/21.    PT Comments    Pt did well with ambulation (~250 ft), stair negotiation (did steps reciprocally), and showed great strength with mobility, exercises.  She still is inconsistent with knowledge of precautions, daughter there and does know them.  Pt is functionally doing very well and has been extremely pleasant and hard working during all PT sessions.   Follow Up Recommendations  Home health PT;Follow surgeon's recommendation for DC plan and follow-up therapies     Equipment Recommendations  Rolling walker with 5" wheels    Recommendations for Other Services       Precautions / Restrictions Precautions Precautions: Posterior Hip Precaution Booklet Issued: Yes (comment) Restrictions RLE Weight Bearing: Weight bearing as tolerated    Mobility  Bed Mobility                  Transfers Overall transfer level: Modified independent Equipment used: Rolling walker (2 wheeled)             General transfer comment: Pt needed less reminders for hand placement, etc in getting to standing  Ambulation/Gait Ambulation/Gait assistance: Supervision Ambulation Distance (Feet): 250 Feet Assistive device: Rolling walker (2 wheeled)       General Gait Details: Pt again took very little time to warm up and ultimately was able to maintain consistent speed and cadence w/o excessive UE use, minimal fatigue, good confidence   Stairs Stairs: Yes   Stair Management: Two rails Number of Stairs: 4 General stair comments: Pt able to reciprocally negotiate up/down 4 steps with b/l rails, also negotiated single step with walker safely and w/o phyiscal assist  Wheelchair Mobility    Modified Rankin (Stroke Patients Only)       Balance Overall balance assessment: Modified  Independent                                          Cognition Arousal/Alertness: Awake/alert Behavior During Therapy: WFL for tasks assessed/performed Overall Cognitive Status: Within Functional Limits for tasks assessed                                        Exercises Total Joint Exercises Ankle Circles/Pumps: AROM;10 reps Heel Slides: Strengthening;10 reps Hip ABduction/ADduction: Strengthening;10 reps Long Arc Quad: Strengthening;10 reps    General Comments        Pertinent Vitals/Pain Pain Assessment: 0-10 Pain Score: 1     Home Living                      Prior Function            PT Goals (current goals can now be found in the care plan section) Acute Rehab PT Goals Patient Stated Goal: go home Progress towards PT goals: Progressing toward goals    Frequency    BID      PT Plan Current plan remains appropriate    Co-evaluation              AM-PAC PT "6 Clicks" Daily Activity  Outcome Measure  Difficulty turning over in bed (including adjusting bedclothes, sheets and blankets)?:  None Difficulty moving from lying on back to sitting on the side of the bed? : None Difficulty sitting down on and standing up from a chair with arms (e.g., wheelchair, bedside commode, etc,.)?: None Help needed moving to and from a bed to chair (including a wheelchair)?: None Help needed walking in hospital room?: None Help needed climbing 3-5 steps with a railing? : None 6 Click Score: 24    End of Session Equipment Utilized During Treatment: Gait belt Activity Tolerance: Patient tolerated treatment well Patient left: with chair alarm set;with call bell/phone within reach;with family/visitor present Nurse Communication: Mobility status PT Visit Diagnosis: Muscle weakness (generalized) (M62.81);Difficulty in walking, not elsewhere classified (R26.2)     Time: 1610-9604 PT Time Calculation (min) (ACUTE ONLY): 31  min  Charges:  $Gait Training: 8-22 mins $Therapeutic Exercise: 8-22 mins                    G Codes:       Malachi Pro, DPT 10/31/2017, 2:53 PM

## 2017-10-31 NOTE — Progress Notes (Signed)
  Subjective: 1 Day Post-Op Procedure(s) (LRB): TOTAL HIP ARTHROPLASTY (Left) Patient reports pain as mild.   Does complain of pain in the right foot from the compression devices. Patient is well, and has had no acute complaints or problems Plan is to go Home after hospital stay. Negative for chest pain and shortness of breath Fever: no Gastrointestinal:Negative for nausea and vomiting  Objective: Vital signs in last 24 hours: Temp:  [96.9 F (36.1 C)-98.9 F (37.2 C)] 98.7 F (37.1 C) (02/22 0336) Pulse Rate:  [51-76] 69 (02/22 0336) Resp:  [11-19] 18 (02/22 0336) BP: (109-172)/(54-74) 129/57 (02/22 0336) SpO2:  [93 %-100 %] 100 % (02/22 0336) FiO2 (%):  [21 %] 21 % (02/21 1114) Weight:  [84.6 kg (186 lb 8 oz)] 84.6 kg (186 lb 8 oz) (02/21 1121)  Intake/Output from previous day:  Intake/Output Summary (Last 24 hours) at 10/31/2017 0747 Last data filed at 10/30/2017 1900 Gross per 24 hour  Intake 2132 ml  Output 1050 ml  Net 1082 ml    Intake/Output this shift: No intake/output data recorded.  Labs: Recent Labs    10/31/17 0514  HGB 10.2*   Recent Labs    10/31/17 0514  WBC 6.7  RBC 3.71*  HCT 30.4*  PLT 262   Recent Labs    10/31/17 0514  NA 140  K 3.6  CL 109  CO2 25  BUN 8  CREATININE 0.92  GLUCOSE 136*  CALCIUM 8.4*   No results for input(s): LABPT, INR in the last 72 hours.   EXAM General - Patient is Alert, Appropriate and Oriented Extremity - ABD soft Sensation intact distally Intact pulses distally Dorsiflexion/Plantar flexion intact Incision: dressing C/D/I No cellulitis present Dressing/Incision - clean, dry, no drainage Motor Function - intact, moving foot and toes well on exam.  Past Medical History:  Diagnosis Date  . Arthritis   . Hypertension   . Stroke [facility](HCC)     Assessment/Plan: 1 Day Post-Op Procedure(s) (LRB): TOTAL HIP ARTHROPLASTY (Left) Active Problems:   Status post total hip replacement, left  Estimated body  mass index is 30.1 kg/m as calculated from the following:   Height as of this encounter: 5\' 6"  (1.676 m).   Weight as of this encounter: 84.6 kg (186 lb 8 oz). Advance diet Up with therapy D/C IV fluids when tolerating po intake.  Labs reviewed this AM, Hg 10.2 and WBC 6.7. Up with therapy today. Provided the patient with a incentive spirometer. Begin working on BM. Plan will be for discharge home tomorrow pending progress with PT today. CBC and BMP ordered for tomorrow morning.  DVT Prophylaxis - Lovenox, Foot Pumps and TED hose Weight-Bearing as tolerated to left leg  J. Horris LatinoLance Rajesh Wyss, PA-C [facility] 10/31/2017, 7:47 AM

## 2017-10-31 NOTE — Progress Notes (Signed)
Physical Therapy Treatment Patient Details Name: Abigail Tran MRN: [id number] DOB: 06-Sep-1951 Today's Date: 10/31/2017    History of Present Illness 67 y/o female s/p R total hip replacement 2/21.    PT Comments    Pt did well with mobility in bed, getting to standing and was able to ambulate ~100 ft with good confidence, safety and was ultimately able to maintain consistent forward momentum with decreasing UE reliance.  Pt showed good strength with supine exercises, easily did SLR and did not have excessive pain.   Follow Up Recommendations  Follow surgeon's recommendation for DC plan and follow-up therapies;Home health PT     Equipment Recommendations  Rolling walker with 5" wheels    Recommendations for Other Services       Precautions / Restrictions Precautions Precautions: Posterior Hip Precaution Booklet Issued: Yes (comment) Restrictions Weight Bearing Restrictions: Yes RLE Weight Bearing: Weight bearing as tolerated    Mobility  Bed Mobility Overal bed mobility: Modified Independent             General bed mobility comments: Pt did well getting to EOB w/o direct assist  Transfers Overall transfer level: Modified independent Equipment used: Rolling walker (2 wheeled)             General transfer comment: Pt did well getting to standing with minimal reminders for hand placement/set up  Ambulation/Gait Ambulation/Gait assistance: Supervision Ambulation Distance (Feet): 100 Feet Assistive device: Rolling walker (2 wheeled)       General Gait Details: Pt did well with prolonged bout of ambulation, she was able to eventually maintain consistent walker motion and though she had some fatigue she ultimately did very well, showed good safety and should be able to circumambuate the nurses' station next session   Stairs            Wheelchair Mobility    Modified Rankin (Stroke Patients Only)       Balance Overall balance assessment: Modified  Independent                                          Cognition Arousal/Alertness: Awake/alert Behavior During Therapy: WFL for tasks assessed/performed Overall Cognitive Status: Within Functional Limits for tasks assessed                                        Exercises Total Joint Exercises Ankle Circles/Pumps: AROM;10 reps Quad Sets: Strengthening;10 reps Gluteal Sets: Strengthening;10 reps Short Arc Quad: Strengthening;15 reps Heel Slides: Strengthening;10 reps Hip ABduction/ADduction: Strengthening;10 reps Straight Leg Raises: AROM;10 reps    General Comments        Pertinent Vitals/Pain Pain Assessment: 0-10 Pain Score: 5     Home Living                      Prior Function            PT Goals (current goals can now be found in the care plan section) Progress towards PT goals: Progressing toward goals    Frequency    BID      PT Plan Current plan remains appropriate    Co-evaluation              AM-PAC PT "6 Clicks" Daily Activity  Outcome Measure  Difficulty turning  over in bed (including adjusting bedclothes, sheets and blankets)?: None Difficulty moving from lying on back to sitting on the side of the bed? : None Difficulty sitting down on and standing up from a chair with arms (e.g., wheelchair, bedside commode, etc,.)?: None Help needed moving to and from a bed to chair (including a wheelchair)?: None Help needed walking in hospital room?: None Help needed climbing 3-5 steps with a railing? : A Little 6 Click Score: 23    End of Session Equipment Utilized During Treatment: Gait belt Activity Tolerance: Patient tolerated treatment well Patient left: with chair alarm set;with call bell/phone within reach;with family/visitor present Nurse Communication: Mobility status PT Visit Diagnosis: Muscle weakness (generalized) (M62.81);Difficulty in walking, not elsewhere classified (R26.2)     Time:  [id number] PT Time Calculation (min) (ACUTE ONLY): 28 min  Charges:  $Gait Training: 8-22 mins $Therapeutic Exercise: 8-22 mins                    G Codes:       Malachi ProGalen R Ausencio Vaden, DPT 10/31/2017, 10:20 AM

## 2017-10-31 NOTE — Care Management Note (Signed)
Case Management Note  Patient Details  Name: NEKEYA BRISKI MRN: [id number] Date of Birth: 1951/01/31  Subjective/Objective:                   RNCM met with patient and her daughter to discuss transition of care. She states she plans to return to home. She will need a rolling walker and will determine if she wants to purchase a 3 in 1 commode.  She uses Product[facility]process development [facility] [phone] for medications. Dr. Roland Rack would like for her to have Lovenox/generic okay 87m injection daily for 14 days- no refills called in to pharmacy for price. Action/Plan:   Home health list provide. [facility]   Expected Discharge Date:  11/01/17               Expected Discharge Plan:     In-House Referral:     Discharge planning Services  CM Consult  Post Acute Care Choice:  Durable Medical Equipment, Home Health Choice offered to:  Patient, Adult Children  DME Arranged:  Walker rolling [facility]ME [facility]     Status of Service:  In process, will continue to follow  If discussed at Long Length of Stay Meetings, dates discussed:    Additional Comments:  AMarshell Garfinkel RN 10/31/2017, 8:58 AM

## 2017-11-01 ENCOUNTER — Inpatient Hospital Stay: Payer: Medicare Other

## 2017-11-01 LAB — BASIC METABOLIC PANEL
ANION GAP: 9 (ref 5–15)
BUN: 13 mg/dL (ref 6–20)
CO2: 24 mmol/L (ref 22–32)
Calcium: 8.2 mg/dL — ABNORMAL LOW (ref 8.9–10.3)
Chloride: 107 mmol/L (ref 101–111)
Creatinine, Ser: 0.78 mg/dL (ref 0.44–1.00)
GFR calc Af Amer: >60 mL/min (ref >60)
GLUCOSE: 107 mg/dL — AB (ref 65–99)
Potassium: 3.4 mmol/L — ABNORMAL LOW (ref 3.5–5.1)
SODIUM: 140 mmol/L (ref 135–145)

## 2017-11-01 LAB — URINALYSIS, COMPLETE (UACMP) WITH MICROSCOPIC
BACTERIA UA: NONE SEEN
BILIRUBIN URINE: NEGATIVE
Glucose, UA: NEGATIVE mg/dL
HGB URINE DIPSTICK: NEGATIVE
Ketones, ur: NEGATIVE mg/dL
LEUKOCYTES UA: NEGATIVE
NITRITE: NEGATIVE
PH: 5 (ref 5.0–8.0)
Protein, ur: NEGATIVE mg/dL
RBC / HPF: NONE SEEN RBC/hpf (ref 0–5)
SPECIFIC GRAVITY, URINE: 1.017 (ref 1.005–1.030)

## 2017-11-01 LAB — CBC WITH DIFFERENTIAL/PLATELET
BASOS ABS: 0 10*3/uL (ref 0–0.1)
BASOS PCT: 0 %
Eosinophils Absolute: 0.2 10*3/uL (ref 0–0.7)
Eosinophils Relative: 3 %
HEMATOCRIT: 29.5 % — AB (ref 35.0–47.0)
Hemoglobin: 10.1 g/dL — ABNORMAL LOW (ref 12.0–16.0)
Lymphocytes Relative: 20 %
Lymphs Abs: 1.6 10*3/uL (ref 1.0–3.6)
MCH: 28.1 pg (ref 26.0–34.0)
MCHC: 34.2 g/dL (ref 32.0–36.0)
MCV: 82.1 fL (ref 80.0–100.0)
MONO ABS: 0.6 10*3/uL (ref 0.2–0.9)
Monocytes Relative: 8 %
NEUTROS ABS: 5.7 10*3/uL (ref 1.4–6.5)
Neutrophils Relative %: 69 %
PLATELETS: 230 10*3/uL (ref 150–440)
RBC: 3.6 MIL/uL — AB (ref 3.80–5.20)
RDW: 14.2 % (ref 11.5–14.5)
WBC: 8.2 10*3/uL (ref 3.6–11.0)

## 2017-11-01 MED ORDER — ENOXAPARIN SODIUM 40 MG/0.4ML ~~LOC~~ SOLN: 40.0000 mg | SUBCUTANEOUS | 0 refills | Status: DC

## 2017-11-01 MED ORDER — OXYCODONE HCL 5 MG PO TABS: 5.0000 mg | ORAL_TABLET | ORAL | 0 refills | Status: DC | PRN

## 2017-11-01 MED ORDER — POTASSIUM CHLORIDE 20 MEQ PO PACK
40.0000 meq | PACK | Freq: Once | ORAL | Status: AC
  Administered 2017-11-01: 40 meq via ORAL
  Filled 2017-11-01: qty 2

## 2017-11-01 NOTE — Discharge Summary (Signed)
Physician Discharge Summary  Patient ID: Abigail Tran MRN: [id number] DOB/AGE: 12-13-1950 67 y.o.  Admit date: 10/30/2017 Discharge date: 11/01/2017  Admission Diagnoses:  PRIMARY OSTEOARTHRITIS OF LEFT HIP  Discharge Diagnoses: Patient Active Problem List   Diagnosis Date Noted  . Status post total hip replacement, left 10/30/2017  Primary osteoarthritis of the left hip.  Past Medical History:  Diagnosis Date  . Arthritis   . Hypertension   . Stroke [facility](HCC)     Transfusion: None.   Consultants (if any):   Discharged Condition: Improved  Hospital Course: Abigail Bolderancy T Yearwood is an 67 y.o. female who was admitted 10/30/2017 with a diagnosis of left hip osteoarthritis and went to the operating room on 10/30/2017 and underwent the above named procedures.   On the night of POD1 patient developed a fever of 101, CXR was negative and UA was negative for any infection.   Surgeries: Procedure(s): TOTAL HIP ARTHROPLASTY on 10/30/2017 Patient tolerated the surgery well. Taken to PACU where she was stabilized and then transferred to the orthopedic floor.  Started on Lovenox 40mg  q 24 hrs. Foot pumps applied bilaterally at 80 mm. Heels elevated on bed with rolled towels. No evidence of DVT. Negative Homan. Physical therapy started on day #1 for gait training and transfer. OT started day #1 for ADL and assisted devices.  Patient's IV was removed on POD1.  Foley was removed in the operating room following surgery.  Implants: Biomet press-fit system with a #9 laterally offset Echo femoral stem, a 52 mm acetabular shell with an E-poly hi-wall liner, and a 36 mm ceramic head with a +0 mm neck adapter.  She was given perioperative antibiotics:  Anti-infectives (From admission, onward)   Start     Dose/Rate Route Frequency Ordered Stop   10/30/17 1400  ceFAZolin (ANCEF) IVPB 2g/100 mL premix     2 g 200 mL/hr over 30 Minutes Intravenous Every 6 hours 10/30/17 1113 10/31/17 0300   10/30/17 0608   ceFAZolin (ANCEF) 2-4 GM/100ML-% IVPB    Comments:  Gwynneth AlimentHarris, Bobbie   : cabinet override      10/30/17 0608 10/30/17 1829   10/29/17 2215  ceFAZolin (ANCEF) IVPB 2g/100 mL premix  Status:  Discontinued     2 g 200 mL/hr over 30 Minutes Intravenous  Once 10/29/17 2212 10/30/17 1112    .  She was given sequential compression devices, early ambulation, and Lovenox for DVT prophylaxis.  She benefited maximally from the hospital stay and there were no complications.    Recent vital signs:  Vitals:   11/01/17 0415 11/01/17 0741  BP: (!) 143/64 (!) 126/49  Pulse: (!) 107 (!) 102  Resp: 19 18  Temp: (!) 101.6 F (38.7 C) 99.3 F (37.4 C)  SpO2: 94% 98%    Recent laboratory studies:  Lab Results  Component Value Date   HGB 10.1 (L) 11/01/2017   HGB 10.2 (L) 10/31/2017   HGB 12.5 10/22/2017   Lab Results  Component Value Date   WBC 8.2 11/01/2017   PLT 230 11/01/2017   Lab Results  Component Value Date   INR 0.98 10/22/2017   Lab Results  Component Value Date   NA 140 11/01/2017   K 3.4 (L) 11/01/2017   CL 107 11/01/2017   CO2 24 11/01/2017   BUN 13 11/01/2017   CREATININE 0.78 11/01/2017   GLUCOSE 107 (H) 11/01/2017    Discharge Medications:   Allergies as of 11/01/2017   No Known Allergies  Medication List    TAKE these medications   amLODipine 10 MG tablet Commonly known as:  NORVASC Take 10 mg by mouth every evening.   atorvastatin 40 MG tablet Commonly known as:  LIPITOR Take 40 mg by mouth every evening.   BC HEADACHE PO Take 1 packet by mouth daily as needed (pain).   carvedilol 3.125 MG tablet Commonly known as:  COREG Take 3.125 mg by mouth 2 (two) times daily.   enoxaparin 40 MG/0.4ML injection Commonly known as:  LOVENOX Inject 0.4 mLs (40 mg total) into the skin daily.   ibuprofen 200 MG tablet Commonly known as:  ADVIL,MOTRIN Take 400 mg by mouth daily as needed for headache or moderate pain.   lisinopril 40 MG tablet Commonly  known as:  PRINIVIL,ZESTRIL Take 40 mg by mouth every evening.   meloxicam 15 MG tablet Commonly known as:  MOBIC Take 15 mg by mouth every evening.   naproxen sodium 220 MG tablet Commonly known as:  ALEVE Take 440 mg by mouth daily as needed (pain).   oxyCODONE 5 MG immediate release tablet Commonly known as:  Oxy IR/ROXICODONE Take 1-2 tablets (5-10 mg total) by mouth every 4 (four) hours as needed for moderate pain.            Durable Medical Equipment  (From admission, onward)        Start     Ordered   10/30/17 1114  DME Walker rolling  Once    Question:  Patient needs a walker to treat with the following condition  Answer:  Status post total hip replacement, left   10/30/17 1113   10/30/17 1114  DME 3 n 1  Once     10/30/17 1113   10/30/17 1114  DME Bedside commode  Once    Question:  Patient needs a bedside commode to treat with the following condition  Answer:  Status post total hip replacement, left   10/30/17 1113      Diagnostic Studies: Dg Chest 2 View  Result Date: 10/22/2017 CLINICAL DATA:  Preoperative examination.  Former smoker. EXAM: CHEST  2 VIEW COMPARISON:  None in PACs FINDINGS: The lungs are well-expanded and clear. The heart and pulmonary vascularity are normal. The mediastinum is normal in width. There is no pleural effusion. The trachea is midline. The bony thorax exhibits no acute abnormality. IMPRESSION: There is no active cardiopulmonary disease. Electronically Signed   By: David  [place] M.D.   On: 10/22/2017 14:14   Mm Digital Screening Bilateral  Result Date: 10/15/2017 CLINICAL DATA:  Screening. EXAM: DIGITAL SCREENING BILATERAL MAMMOGRAM WITH CAD COMPARISON:  Previous exam(s). ACR Breast Density Category b: There are scattered areas of fibroglandular density. FINDINGS: There are no findings suspicious for malignancy. Images were processed with CAD. IMPRESSION: No mammographic evidence of malignancy. A result letter of this screening  mammogram will be mailed directly to the patient. RECOMMENDATION: Screening mammogram in one year. (Code:[id number]-B-01Y) BI-RADS CATEGORY  1: Negative. Electronically Signed   By: Ted Mcalpine M.D.   On: 10/15/2017 17:00   Dg Hip Unilat W Or W/o Pelvis 2-3 Views Left  Result Date: 10/30/2017 CLINICAL DATA:  Status post left hip arthroplasty EXAM: DG HIP (WITH OR WITHOUT PELVIS) 2-3V LEFT COMPARISON:  None. FINDINGS: Changes of left hip replacement. No hardware bony complicating feature. Normal alignment. Advanced degenerative changes in the right hip. IMPRESSION: Left hip replacement.  No complicating feature. Electronically Signed   By: Charlett Nose M.D.   On:  10/30/2017 10:45   Disposition: Plan will be for discharge home today pending work-up for fevers last night.  Signed: Meriel Pica PA-C 11/01/2017, 9:08 AM

## 2017-11-01 NOTE — Plan of Care (Signed)
  Education: Knowledge of General Education information will improve 11/01/2017 0557 - Progressing by Itzabella Sorrels, Genelle GatherMatthew Scott, RN   Health Behavior/Discharge Planning: Ability to manage health-related needs will improve 11/01/2017 0557 - Progressing by Raye Slyter, Genelle GatherMatthew Scott, RN   Clinical Measurements: Ability to maintain clinical measurements within normal limits will improve 11/01/2017 0557 - Progressing by Estell Puccini, Genelle GatherMatthew Scott, RN Will remain free from infection 11/01/2017 0557 - Progressing by Brock Mokry, Genelle GatherMatthew Scott, RN Diagnostic test results will improve 11/01/2017 0557 - Progressing by Taha Dimond, Genelle GatherMatthew Scott, RN Respiratory complications will improve 11/01/2017 0557 - Progressing by Roxana HiresPage, Maysel Mccolm Scott, RN Cardiovascular complication will be avoided 11/01/2017 0557 - Progressing by Roxana HiresPage, Amaranta Mehl Scott, RN   Activity: Risk for activity intolerance will decrease 11/01/2017 0557 - Progressing by Roxana HiresPage, Kire Ferg Scott, RN   Nutrition: Adequate nutrition will be maintained 11/01/2017 0557 - Progressing by Roxana HiresPage, Tawney Vanorman Scott, RN   Coping: Level of anxiety will decrease 11/01/2017 0557 - Progressing by Roxana HiresPage, Desarea Ohagan Scott, RN   Elimination: Will not experience complications related to bowel motility 11/01/2017 0557 - Progressing by Roxana HiresPage, Michaeljames Milnes Scott, RN Will not experience complications related to urinary retention 11/01/2017 0557 - Progressing by Kinzly Pierrelouis, Genelle GatherMatthew Scott, RN   Pain Managment: General experience of comfort will improve 11/01/2017 0557 - Progressing by Virga Haltiwanger, Genelle GatherMatthew Scott, RN   Safety: Ability to remain free from injury will improve 11/01/2017 0557 - Progressing by Terrall Bley, Genelle GatherMatthew Scott, RN

## 2017-11-01 NOTE — Progress Notes (Signed)
  Subjective: 2 Days Post-Op Procedure(s) (LRB): TOTAL HIP ARTHROPLASTY (Left) Patient reports pain as mild.   Pt denies any issues, doing well. Patient is well, and has had no acute complaints or problems Plan is to go Home after hospital stay. Negative for chest pain and shortness of breath Fever: Yes 101 last night, when I entered room the temperature was set to 80 degrees and she was under blankets as well. Gastrointestinal:Negative for nausea and vomiting No urinary symptoms today.  Objective: Vital signs in last 24 hours: Temp:  [98.4 F (36.9 C)-101.6 F (38.7 C)] 99.3 F (37.4 C) (02/23 0741) Pulse Rate:  [67-107] 102 (02/23 0741) Resp:  [18-19] 18 (02/23 0741) BP: (103-150)/(49-80) 126/49 (02/23 0741) SpO2:  [94 %-98 %] 98 % (02/23 0741)  Intake/Output from previous day:  Intake/Output Summary (Last 24 hours) at 11/01/2017 0903 Last data filed at 11/01/2017 0418 Gross per 24 hour  Intake 600 ml  Output -  Net 600 ml    Intake/Output this shift: No intake/output data recorded.  Labs: Recent Labs    10/31/17 0514 11/01/17 0442  HGB 10.2* 10.1*   Recent Labs    10/31/17 0514 11/01/17 0442  WBC 6.7 8.2  RBC 3.71* 3.60*  HCT 30.4* 29.5*  PLT 262 230   Recent Labs    10/31/17 0514 11/01/17 0442  NA 140 140  K 3.6 3.4*  CL 109 107  CO2 25 24  BUN 8 13  CREATININE 0.92 0.78  GLUCOSE 136* 107*  CALCIUM 8.4* 8.2*   No results for input(s): LABPT, INR in the last 72 hours.   EXAM General - Patient is Alert, Appropriate and Oriented Extremity - ABD soft Sensation intact distally Intact pulses distally Dorsiflexion/Plantar flexion intact Incision: dressing C/D/I No cellulitis present Dressing/Incision - clean, dry, no drainage Motor Function - intact, moving foot and toes well on exam.  Past Medical History:  Diagnosis Date  . Arthritis   . Hypertension   . Stroke [facility](HCC)     Assessment/Plan: 2 Days Post-Op Procedure(s) (LRB): TOTAL HIP  ARTHROPLASTY (Left) Active Problems:   Status post total hip replacement, left  Estimated body mass index is 30.1 kg/m as calculated from the following:   Height as of this encounter: 5\' 6"  (1.676 m).   Weight as of this encounter: 84.6 kg (186 lb 8 oz). Advance diet Up with therapy  Labs reviewed this AM, Hg 10.2 and WBC 8.2 Up with therapy today. Encouraged incentive spirometer. Begin working on BM. Move onto to FLEET enema if needed. Fevers last night, will obtain UA and CXR. CBC and BMP ordered for tomorrow morning.  DVT Prophylaxis - Lovenox, Foot Pumps and TED hose Weight-Bearing as tolerated to left leg  J. Horris LatinoLance McGhee, PA-C [facility] 11/01/2017, 9:03 AM

## 2017-11-01 NOTE — Discharge Instructions (Signed)
Instructions after Total Hip Replacement     J. Jeffrey Poggi, M.D.  J. Lance Jocelyne Reinertsen, PA-C     Dept. of Orthopaedics & Sports Medicine  [facility]  [address]  [place], [place]  [address]  Phone: [phone]   Fax: [phone]    DIET: . Drink plenty of non-alcoholic fluids. . Resume your normal diet. Include foods high in fiber.  ACTIVITY:  . You may use crutches or a walker with weight-bearing as tolerated, unless instructed otherwise. . You may be weaned off of the walker or crutches by your Physical Therapist.  . Do NOT reach below the level of your knees or cross your legs until allowed.    . Continue doing gentle exercises. Exercising will reduce the pain and swelling, increase motion, and prevent muscle weakness.   . Please continue to use the TED compression stockings for 6 weeks. You may remove the stockings at night, but should reapply them in the morning. . Do not drive or operate any equipment until instructed.  WOUND CARE:  . Continue to use ice packs periodically to reduce pain and swelling. . Keep the incision clean and dry. . You may bathe or shower after the staples are removed at the first office visit following surgery.  MEDICATIONS: . You may resume your regular medications. . Please take the pain medication as prescribed on the medication. . Do not take pain medication on an empty stomach. . You have been given a prescription for a blood thinner to prevent blood clots. Please take the medication as instructed. (NOTE: After completing a 2 week course of Lovenox, take one Enteric-coated aspirin once a day.) . Pain medications and iron supplements can cause constipation. Use a stool softener (Senokot or Colace) on a daily basis and a laxative (dulcolax or miralax) as needed. . Do not drive or drink alcoholic beverages when taking pain medications.  CALL THE OFFICE FOR: . Temperature above 101 degrees . Excessive bleeding or drainage on the  dressing. . Excessive swelling, coldness, or paleness of the toes. . Persistent nausea and vomiting.  FOLLOW-UP:  . You should have an appointment to return to the office in 2 weeks after surgery. . Arrangements have been made for continuation of Physical Therapy (either home therapy or outpatient therapy).  

## 2017-11-01 NOTE — Progress Notes (Signed)
Physical Therapy Treatment Patient Details Name: Clint Bolderancy T Hazell MRN: [id number] DOB: 1951/07/04 Today's Date: 11/01/2017    History of Present Illness 67 y/o female s/p R total hip replacement 2/21.    PT Comments    Participated in exercises as described below.  Bed mobility without assist.  She was able to complete a full lap around nursing unit with ease.  Stated she was comfortable on stairs and declined review.  She does need minimal verbal cues for safety with hand placements but overall does well.   Follow Up Recommendations  Home health PT;Follow surgeon's recommendation for DC plan and follow-up therapies     Equipment Recommendations  Rolling walker with 5" wheels    Recommendations for Other Services       Precautions / Restrictions Precautions Precautions: Posterior Hip Precaution Booklet Issued: Yes (comment) Restrictions Weight Bearing Restrictions: Yes RLE Weight Bearing: Weight bearing as tolerated    Mobility  Bed Mobility Overal bed mobility: Modified Independent                Transfers Overall transfer level: Modified independent Equipment used: Rolling walker (2 wheeled)             General transfer comment: did need some verbal cues for hand placements.  Ambulation/Gait Ambulation/Gait assistance: Supervision Ambulation Distance (Feet): 250 Feet Assistive device: Rolling walker (2 wheeled) Gait Pattern/deviations: Step-through pattern   Gait velocity interpretation: Below normal speed for age/gender General Gait Details: Pt again took very little time to warm up and ultimately was able to maintain consistent speed and cadence w/o excessive UE use, minimal fatigue, good confidence   Stairs            Wheelchair Mobility    Modified Rankin (Stroke Patients Only)       Balance Overall balance assessment: Modified Independent                                          Cognition Arousal/Alertness:  Awake/alert Behavior During Therapy: WFL for tasks assessed/performed Overall Cognitive Status: Within Functional Limits for tasks assessed                                        Exercises Total Joint Exercises Ankle Circles/Pumps: AROM;10 reps Quad Sets: Strengthening;10 reps Gluteal Sets: Strengthening;10 reps Short Arc Quad: Strengthening;15 reps Heel Slides: Strengthening;10 reps Hip ABduction/ADduction: Strengthening;10 reps Straight Leg Raises: AROM;10 reps    General Comments        Pertinent Vitals/Pain Pain Assessment: No/denies pain    Home Living                      Prior Function            PT Goals (current goals can now be found in the care plan section) Progress towards PT goals: Progressing toward goals    Frequency    BID      PT Plan Current plan remains appropriate    Co-evaluation              AM-PAC PT "6 Clicks" Daily Activity  Outcome Measure  Difficulty turning over in bed (including adjusting bedclothes, sheets and blankets)?: None Difficulty moving from lying on back to sitting on the side of the bed? : None  Difficulty sitting down on and standing up from a chair with arms (e.g., wheelchair, bedside commode, etc,.)?: None Help needed moving to and from a bed to chair (including a wheelchair)?: None Help needed walking in hospital room?: None Help needed climbing 3-5 steps with a railing? : None 6 Click Score: 24    End of Session Equipment Utilized During Treatment: Gait belt Activity Tolerance: Patient tolerated treatment well Patient left: with chair alarm set;with call bell/phone within reach;in chair         Time: 1610-9604 PT Time Calculation (min) (ACUTE ONLY): 19 min  Charges:  $Gait Training: 8-22 mins                    G Codes:       Danielle Dess, PTA 11/01/17, 9:22 AM

## 2017-11-01 NOTE — Care Management Note (Signed)
Case Management Note  Patient Details  Name: Abigail Tran MRN: [id number] Date of Birth: 1950-10-02  Subjective/Objective:                    Action/Plan:   Expected Discharge Date:  11/01/17               Expected Discharge Plan:  Home w Home Health Services  In-House Referral:     Discharge planning Services  CM Consult  Post Acute Care Choice:  Durable Medical Equipment, Home Health Choice offered to:  Patient, Adult Children  DME Arranged:  Walker rolling DME Agency:  [facility]  HH Arranged:  PT [facility]  Status of Service:  Completed, signed off  If discussed at Long Length of Stay Meetings, dates discussed:    Additional Comments:  Kron Everton A, RN 11/01/2017, 3:17 PM

## 2017-11-01 NOTE — Progress Notes (Addendum)
Patient VSS. A&O x4. Discharge instructions and new medication teaching given to patient. Patient is awaiting transport to home via daughter. Patient will continue to be monitored until discharge.  Abigail SeaKimberly Keiton Cosma, SN [facility]

## 2017-11-01 NOTE — Care Management Note (Signed)
Case Management Note  Patient Details  Name: Clint Bolderancy T Byron MRN: [id number] Date of Birth: October 09, 1950  Subjective/Objective:    [facility] for HH=PT [facility] [place] [facility]. Lovenox has been called in earlier this week and Mrs Marvis MoellerMiles already has her walker delivered.                 Action/Plan:   Expected Discharge Date:  11/01/17               Expected Discharge Plan:  Home w Home Health Services  In-House Referral:     Discharge planning Services  CM Consult  Post Acute Care Choice:  Durable Medical Equipment, Home Health Choice offered to:  Patient, Adult Children  DME Arranged:  Walker rolling DME Agency:  [facility]  HH Arranged:  PT [facility]  Status of Service:  Completed, signed off  If discussed at [employer] Length of Stay Meetings, dates discussed:    Additional Comments:  Jandy Brackens A, RN 11/01/2017, 3:16 PM

## 2017-11-02 LAB — URINE CULTURE: Culture: NO GROWTH

## 2017-11-03 LAB — SURGICAL PATHOLOGY

## 2018-01-09 ENCOUNTER — Encounter
Admission: RE | Admit: 2018-01-09 | Discharge: 2018-01-09 | Disposition: A | Discharge status: Home / Self Care | Payer: Medicare Other | Source: Ambulatory Visit | Attending: Surgery | Admitting: Surgery

## 2018-01-09 ENCOUNTER — Other Ambulatory Visit: Payer: Self-pay

## 2018-01-09 ENCOUNTER — Ambulatory Visit
Admission: RE | Admit: 2018-01-09 | Discharge: 2018-01-09 | Disposition: A | Discharge status: Home / Self Care | Payer: Medicare Other | Source: Ambulatory Visit | Attending: Surgery | Admitting: Surgery

## 2018-01-09 DIAGNOSIS — Z01818 Encounter for other preprocedural examination: Secondary | ICD-10-CM | POA: Diagnosis present

## 2018-01-09 DIAGNOSIS — Z01810 Encounter for preprocedural cardiovascular examination: Secondary | ICD-10-CM | POA: Insufficient documentation

## 2018-01-09 DIAGNOSIS — Z01812 Encounter for preprocedural laboratory examination: Secondary | ICD-10-CM | POA: Diagnosis present

## 2018-01-09 DIAGNOSIS — M1611 Unilateral primary osteoarthritis, right hip: Secondary | ICD-10-CM | POA: Diagnosis not present

## 2018-01-09 HISTORY — DX: Prediabetes: R73.03

## 2018-01-09 LAB — BASIC METABOLIC PANEL
Anion gap: 6 (ref 5–15)
BUN: 6 mg/dL (ref 6–20)
CALCIUM: 9.1 mg/dL (ref 8.9–10.3)
CO2: 30 mmol/L (ref 22–32)
Chloride: 104 mmol/L (ref 101–111)
Creatinine, Ser: 0.65 mg/dL (ref 0.44–1.00)
GFR calc Af Amer: >60 mL/min (ref >60)
GFR calc non Af Amer: >60 mL/min (ref >60)
Glucose, Bld: 130 mg/dL — ABNORMAL HIGH (ref 65–99)
POTASSIUM: 2.7 mmol/L — AB (ref 3.5–5.1)
Sodium: 140 mmol/L (ref 135–145)

## 2018-01-09 LAB — CBC
HCT: 36.1 % (ref 35.0–47.0)
HEMOGLOBIN: 12.1 g/dL (ref 12.0–16.0)
MCH: 27.6 pg (ref 26.0–34.0)
MCHC: 33.5 g/dL (ref 32.0–36.0)
MCV: 82.2 fL (ref 80.0–100.0)
Platelets: 349 10*3/uL (ref 150–440)
RBC: 4.39 MIL/uL (ref 3.80–5.20)
RDW: 15.5 % — ABNORMAL HIGH (ref 11.5–14.5)
WBC: 5.4 10*3/uL (ref 3.6–11.0)

## 2018-01-09 LAB — URINALYSIS, ROUTINE W REFLEX MICROSCOPIC
BILIRUBIN URINE: NEGATIVE
Glucose, UA: NEGATIVE mg/dL
HGB URINE DIPSTICK: NEGATIVE
Ketones, ur: NEGATIVE mg/dL
Leukocytes, UA: NEGATIVE
NITRITE: NEGATIVE
PROTEIN: NEGATIVE mg/dL
Specific Gravity, Urine: 1.017 (ref 1.005–1.030)
pH: 5 (ref 5.0–8.0)

## 2018-01-09 LAB — TYPE AND SCREEN
ABO/RH(D): B POS
Antibody Screen: NEGATIVE

## 2018-01-09 LAB — PROTIME-INR
INR: 0.93
Prothrombin Time: 12.4 seconds (ref 11.4–15.2)

## 2018-01-09 LAB — SURGICAL PCR SCREEN
MRSA, PCR: NEGATIVE
Staphylococcus aureus: NEGATIVE

## 2018-01-09 NOTE — Pre-Procedure Instructions (Signed)
Low K+ level results faxed to Dr. Joice Lofts office along with a phone call to the on-call answering service. Patient will need K+ supplement prior to surgery.

## 2018-01-09 NOTE — Patient Instructions (Signed)
Your procedure is scheduled on: Thursday, Jan 22, 2018 Report to Day Surgery on the 2nd floor of the [facility] To find out your arrival time, please call [phone] between 1PM - 3PM on: Wednesday, Jan 21, 2018  REMEMBER: Instructions that are not followed completely may result in serious medical risk, up to and including death; or upon the discretion of your surgeon and anesthesiologist your surgery may need to be rescheduled.  Do not eat food after midnight the night before your procedure.  No gum chewing, lozengers or hard candies.  You may however, drink CLEAR liquids up to 2 hours before you are scheduled to arrive for your surgery. Do not drink anything within 2 hours of the start of your surgery.  Clear liquids include: - water  - apple juice without pulp - clear gatorade - black coffee or tea (Do NOT add anything to the coffee or tea) Do NOT drink anything that is not on this list.  No Alcohol for 24 hours before or after surgery.  No Smoking including e-cigarettes for 24 hours prior to surgery.  No chewable tobacco products for at least 6 hours prior to surgery.  No nicotine patches on the day of surgery.  On the morning of surgery brush your teeth with toothpaste and water, you may rinse your mouth with mouthwash if you wish. Do not swallow any toothpaste or mouthwash.  Notify your doctor if there is any change in your medical condition (cold, fever, infection).  Do not wear jewelry, make-up, hairpins, clips or nail polish.  Do not wear lotions, powders, or perfumes. You may wear deodorant.  Do not shave 48 hours prior to surgery.   Contacts and dentures may not be worn into surgery.  Do not bring valuables to the hospital, including drivers license, insurance or credit cards.  [facility] is not responsible for any belongings or valuables.   TAKE THESE MEDICATIONS THE MORNING OF SURGERY:  1.  carvedilol  Use CHG Soap as directed on instruction  sheet.  May 9 - Stop Anti-inflammatories (NSAIDS) such as Meloxicam, Advil, Aleve, Ibuprofen, Motrin, Naproxen, Naprosyn and Aspirin based products such as Excedrin, Goodys Powder, BC Powder. (May take Tylenol or Acetaminophen if needed.)  May 9 - Stop ANY OVER THE COUNTER supplements until after surgery.  Wear comfortable clothing (specific to your surgery type) to the hospital.  Plan for stool softeners for home use.  If you are being admitted to the hospital overnight, leave your suitcase in the car. After surgery it may be brought to your room.  If you are taking public transportation, you will need to have a responsible adult with you. Please confirm with your physician that it is acceptable to use public transportation.   Please call [phone] if you have any questions about these instructions.

## 2018-01-22 ENCOUNTER — Other Ambulatory Visit: Payer: Self-pay

## 2018-01-22 ENCOUNTER — Encounter
Admission: RE | Disposition: A | Discharge status: Home Health | Payer: Self-pay | Source: Ambulatory Visit | Attending: Surgery

## 2018-01-22 ENCOUNTER — Inpatient Hospital Stay: Payer: Medicare Other

## 2018-01-22 ENCOUNTER — Inpatient Hospital Stay
Admission: RE | Admit: 2018-01-22 | Discharge: 2018-01-24 | DRG: 470 | Disposition: A | Discharge status: Home Health | Payer: Medicare Other | Source: Ambulatory Visit | Attending: Surgery | Admitting: Surgery

## 2018-01-22 ENCOUNTER — Inpatient Hospital Stay: Payer: Medicare Other | Admitting: Registered Nurse

## 2018-01-22 ENCOUNTER — Encounter: Payer: Self-pay | Admitting: *Deleted

## 2018-01-22 DIAGNOSIS — Z87891 Personal history of nicotine dependence: Secondary | ICD-10-CM

## 2018-01-22 DIAGNOSIS — Z96641 Presence of right artificial hip joint: Secondary | ICD-10-CM

## 2018-01-22 DIAGNOSIS — D62 Acute posthemorrhagic anemia: Secondary | ICD-10-CM | POA: Diagnosis not present

## 2018-01-22 DIAGNOSIS — M1611 Unilateral primary osteoarthritis, right hip: Secondary | ICD-10-CM | POA: Diagnosis present

## 2018-01-22 DIAGNOSIS — Z96642 Presence of left artificial hip joint: Secondary | ICD-10-CM | POA: Diagnosis present

## 2018-01-22 DIAGNOSIS — Z90710 Acquired absence of both cervix and uterus: Secondary | ICD-10-CM | POA: Diagnosis not present

## 2018-01-22 DIAGNOSIS — Z8673 Personal history of transient ischemic attack (TIA), and cerebral infarction without residual deficits: Secondary | ICD-10-CM | POA: Diagnosis not present

## 2018-01-22 DIAGNOSIS — R7303 Prediabetes: Secondary | ICD-10-CM | POA: Diagnosis present

## 2018-01-22 DIAGNOSIS — I10 Essential (primary) hypertension: Secondary | ICD-10-CM | POA: Diagnosis present

## 2018-01-22 HISTORY — PX: TOTAL HIP ARTHROPLASTY: SHX124

## 2018-01-22 LAB — POCT I-STAT 4, (NA,K, GLUC, HGB,HCT)
GLUCOSE: 111 mg/dL — AB (ref 65–99)
HEMATOCRIT: 34 % — AB (ref 36.0–46.0)
HEMOGLOBIN: 11.6 g/dL — AB (ref 12.0–15.0)
POTASSIUM: 3.7 mmol/L (ref 3.5–5.1)
SODIUM: 142 mmol/L (ref 135–145)

## 2018-01-22 SURGERY — ARTHROPLASTY, HIP, TOTAL,POSTERIOR APPROACH
Anesthesia: Spinal | Site: Hip | Laterality: Right | Wound class: Clean

## 2018-01-22 MED ORDER — BISACODYL 10 MG RE SUPP
10.0000 mg | Freq: Every day | RECTAL | Status: DC | PRN
  Administered 2018-01-24: 10 mg via RECTAL
  Filled 2018-01-22: qty 1

## 2018-01-22 MED ORDER — PHENYLEPHRINE HCL 10 MG/ML IJ SOLN
INTRAMUSCULAR | Status: DC | PRN
  Administered 2018-01-22: 100 ug via INTRAVENOUS
  Administered 2018-01-22 (×2): 50 ug via INTRAVENOUS

## 2018-01-22 MED ORDER — EPHEDRINE SULFATE 50 MG/ML IJ SOLN
INTRAMUSCULAR | Status: DC | PRN
  Administered 2018-01-22 (×5): 5 mg via INTRAVENOUS

## 2018-01-22 MED ORDER — FAMOTIDINE 20 MG PO TABS
ORAL_TABLET | ORAL | Status: AC
Start: 2018-01-22 — End: 2018-01-22
  Filled 2018-01-22: qty 1

## 2018-01-22 MED ORDER — MIDAZOLAM HCL 2 MG/2ML IJ SOLN
INTRAMUSCULAR | Status: AC
  Filled 2018-01-22: qty 2

## 2018-01-22 MED ORDER — ATORVASTATIN CALCIUM 20 MG PO TABS
40.0000 mg | ORAL_TABLET | Freq: Every evening | ORAL | Status: DC
  Administered 2018-01-22 – 2018-01-23 (×2): 40 mg via ORAL
  Filled 2018-01-22 (×2): qty 2

## 2018-01-22 MED ORDER — MIDAZOLAM HCL 5 MG/5ML IJ SOLN
INTRAMUSCULAR | Status: DC | PRN
  Administered 2018-01-22 (×2): 1 mg via INTRAVENOUS

## 2018-01-22 MED ORDER — CEFAZOLIN SODIUM-DEXTROSE 2-4 GM/100ML-% IV SOLN
2.0000 g | Freq: Four times a day (QID) | INTRAVENOUS | Status: AC
  Administered 2018-01-22 – 2018-01-23 (×3): 2 g via INTRAVENOUS
  Filled 2018-01-22 (×3): qty 100

## 2018-01-22 MED ORDER — CARVEDILOL 3.125 MG PO TABS
6.2500 mg | ORAL_TABLET | Freq: Every day | ORAL | Status: DC
  Administered 2018-01-23 – 2018-01-24 (×2): 6.25 mg via ORAL
  Filled 2018-01-22 (×2): qty 2

## 2018-01-22 MED ORDER — KETOROLAC TROMETHAMINE 30 MG/ML IJ SOLN
30.0000 mg | Freq: Once | INTRAMUSCULAR | Status: AC
  Administered 2018-01-22: 30 mg via INTRAVENOUS

## 2018-01-22 MED ORDER — TRANEXAMIC ACID 1000 MG/10ML IV SOLN
INTRAVENOUS | Status: DC | PRN
  Administered 2018-01-22: 1000 mg via TOPICAL

## 2018-01-22 MED ORDER — KETOROLAC TROMETHAMINE 15 MG/ML IJ SOLN
15.0000 mg | Freq: Four times a day (QID) | INTRAMUSCULAR | Status: AC
  Administered 2018-01-22 – 2018-01-23 (×4): 15 mg via INTRAVENOUS
  Filled 2018-01-22 (×4): qty 1

## 2018-01-22 MED ORDER — KETOROLAC TROMETHAMINE 30 MG/ML IJ SOLN
INTRAMUSCULAR | Status: AC
  Administered 2018-01-22: 30 mg via INTRAVENOUS
  Filled 2018-01-22: qty 1

## 2018-01-22 MED ORDER — METOCLOPRAMIDE HCL 5 MG/ML IJ SOLN: 5.0000 mg | Freq: Three times a day (TID) | INTRAMUSCULAR | Status: DC | PRN

## 2018-01-22 MED ORDER — BUPIVACAINE-EPINEPHRINE (PF) 0.5% -1:200000 IJ SOLN
INTRAMUSCULAR | Status: AC
  Filled 2018-01-22: qty 30

## 2018-01-22 MED ORDER — BUPIVACAINE HCL (PF) 0.5 % IJ SOLN
INTRAMUSCULAR | Status: DC | PRN
  Administered 2018-01-22: 3 mL

## 2018-01-22 MED ORDER — OXYCODONE HCL 5 MG PO TABS
5.0000 mg | ORAL_TABLET | ORAL | 0 refills | Status: AC | PRN
Start: 2018-01-22 — End: ?

## 2018-01-22 MED ORDER — ACETAMINOPHEN 500 MG PO TABS
1000.0000 mg | ORAL_TABLET | Freq: Four times a day (QID) | ORAL | Status: AC
  Administered 2018-01-22 – 2018-01-23 (×4): 1000 mg via ORAL
  Filled 2018-01-22 (×4): qty 2

## 2018-01-22 MED ORDER — TRAMADOL HCL 50 MG PO TABS: 50.0000 mg | ORAL_TABLET | Freq: Four times a day (QID) | ORAL | Status: DC | PRN

## 2018-01-22 MED ORDER — ENOXAPARIN SODIUM 40 MG/0.4ML ~~LOC~~ SOLN: 40.0000 mg | SUBCUTANEOUS | 0 refills | Status: DC

## 2018-01-22 MED ORDER — BUPIVACAINE-EPINEPHRINE (PF) 0.5% -1:200000 IJ SOLN
INTRAMUSCULAR | Status: DC | PRN
  Administered 2018-01-22: 30 mL via PERINEURAL

## 2018-01-22 MED ORDER — AMLODIPINE BESYLATE 10 MG PO TABS
10.0000 mg | ORAL_TABLET | Freq: Every evening | ORAL | Status: DC
  Administered 2018-01-22: 10 mg via ORAL
  Filled 2018-01-22 (×3): qty 1

## 2018-01-22 MED ORDER — EPHEDRINE SULFATE 50 MG/ML IJ SOLN
INTRAMUSCULAR | Status: AC
  Filled 2018-01-22: qty 1

## 2018-01-22 MED ORDER — FLEET ENEMA 7-19 GM/118ML RE ENEM: 1.0000 | ENEMA | Freq: Once | RECTAL | Status: DC | PRN

## 2018-01-22 MED ORDER — ACETAMINOPHEN 325 MG PO TABS
325.0000 mg | ORAL_TABLET | Freq: Four times a day (QID) | ORAL | Status: DC | PRN
  Administered 2018-01-23: 650 mg via ORAL
  Filled 2018-01-22: qty 2

## 2018-01-22 MED ORDER — SODIUM CHLORIDE 0.9 % IV SOLN
INTRAVENOUS | Status: DC
  Administered 2018-01-22: 12:00:00 via INTRAVENOUS

## 2018-01-22 MED ORDER — TRANEXAMIC ACID 1000 MG/10ML IV SOLN
INTRAVENOUS | Status: AC
  Filled 2018-01-22: qty 10

## 2018-01-22 MED ORDER — ONDANSETRON HCL 4 MG/2ML IJ SOLN
INTRAMUSCULAR | Status: DC | PRN
  Administered 2018-01-22: 4 mg via INTRAVENOUS

## 2018-01-22 MED ORDER — SODIUM CHLORIDE 0.9 % IV SOLN
INTRAVENOUS | Status: DC | PRN
  Administered 2018-01-22: 60 mL

## 2018-01-22 MED ORDER — PHENYLEPHRINE HCL 10 MG/ML IJ SOLN
INTRAMUSCULAR | Status: AC
  Filled 2018-01-22: qty 1

## 2018-01-22 MED ORDER — MAGNESIUM HYDROXIDE 400 MG/5ML PO SUSP
30.0000 mL | Freq: Every day | ORAL | Status: DC | PRN
  Administered 2018-01-23 – 2018-01-24 (×2): 30 mL via ORAL
  Filled 2018-01-22 (×2): qty 30

## 2018-01-22 MED ORDER — OXYCODONE HCL 5 MG PO TABS
5.0000 mg | ORAL_TABLET | ORAL | Status: DC | PRN
  Administered 2018-01-22 – 2018-01-23 (×3): 5 mg via ORAL
  Administered 2018-01-24: 10 mg via ORAL
  Filled 2018-01-22 (×2): qty 1
  Filled 2018-01-22: qty 2
  Filled 2018-01-22: qty 1

## 2018-01-22 MED ORDER — LACTATED RINGERS IV SOLN
INTRAVENOUS | Status: DC
  Administered 2018-01-22: 07:00:00 via INTRAVENOUS

## 2018-01-22 MED ORDER — ONDANSETRON HCL 4 MG/2ML IJ SOLN: 4.0000 mg | Freq: Four times a day (QID) | INTRAMUSCULAR | Status: DC | PRN

## 2018-01-22 MED ORDER — PROPOFOL 500 MG/50ML IV EMUL
INTRAVENOUS | Status: DC | PRN
  Administered 2018-01-22: 50 ug/kg/min via INTRAVENOUS

## 2018-01-22 MED ORDER — GLYCOPYRROLATE 0.2 MG/ML IJ SOLN
INTRAMUSCULAR | Status: DC | PRN
  Administered 2018-01-22: 0.2 mg via INTRAVENOUS

## 2018-01-22 MED ORDER — ENOXAPARIN SODIUM 40 MG/0.4ML ~~LOC~~ SOLN
40.0000 mg | SUBCUTANEOUS | Status: DC
  Administered 2018-01-23 – 2018-01-24 (×2): 40 mg via SUBCUTANEOUS
  Filled 2018-01-22 (×2): qty 0.4

## 2018-01-22 MED ORDER — NEOMYCIN-POLYMYXIN B GU 40-200000 IR SOLN
Status: AC
  Filled 2018-01-22: qty 20

## 2018-01-22 MED ORDER — BUPIVACAINE HCL (PF) 0.5 % IJ SOLN
INTRAMUSCULAR | Status: AC
  Filled 2018-01-22: qty 30

## 2018-01-22 MED ORDER — DOCUSATE SODIUM 100 MG PO CAPS
100.0000 mg | ORAL_CAPSULE | Freq: Two times a day (BID) | ORAL | Status: DC
  Administered 2018-01-22 – 2018-01-24 (×4): 100 mg via ORAL
  Filled 2018-01-22 (×4): qty 1

## 2018-01-22 MED ORDER — DIPHENHYDRAMINE HCL 12.5 MG/5ML PO ELIX: 12.5000 mg | ORAL_SOLUTION | ORAL | Status: DC | PRN

## 2018-01-22 MED ORDER — METOCLOPRAMIDE HCL 10 MG PO TABS: 5.0000 mg | ORAL_TABLET | Freq: Three times a day (TID) | ORAL | Status: DC | PRN

## 2018-01-22 MED ORDER — FENTANYL CITRATE (PF) 100 MCG/2ML IJ SOLN
INTRAMUSCULAR | Status: DC | PRN
  Administered 2018-01-22 (×2): 50 ug via INTRAVENOUS

## 2018-01-22 MED ORDER — FENTANYL CITRATE (PF) 100 MCG/2ML IJ SOLN
INTRAMUSCULAR | Status: AC
  Filled 2018-01-22: qty 2

## 2018-01-22 MED ORDER — PANTOPRAZOLE SODIUM 40 MG PO TBEC
40.0000 mg | DELAYED_RELEASE_TABLET | Freq: Every day | ORAL | Status: DC
  Administered 2018-01-23 – 2018-01-24 (×2): 40 mg via ORAL
  Filled 2018-01-22 (×2): qty 1

## 2018-01-22 MED ORDER — ASPIRIN-SALICYLAMIDE-CAFFEINE 325-95-16 MG PO TABS: ORAL_TABLET | Freq: Every day | ORAL | Status: DC | PRN

## 2018-01-22 MED ORDER — PHENYLEPHRINE HCL 10 MG/ML IJ SOLN
INTRAMUSCULAR | Status: DC | PRN
  Administered 2018-01-22: 40 ug/min via INTRAVENOUS

## 2018-01-22 MED ORDER — FAMOTIDINE 20 MG PO TABS
20.0000 mg | ORAL_TABLET | Freq: Once | ORAL | Status: AC
  Administered 2018-01-22: 20 mg via ORAL

## 2018-01-22 MED ORDER — CEFAZOLIN SODIUM-DEXTROSE 2-4 GM/100ML-% IV SOLN
2.0000 g | Freq: Once | INTRAVENOUS | Status: AC
  Administered 2018-01-22: 2 g via INTRAVENOUS

## 2018-01-22 MED ORDER — PROPOFOL 500 MG/50ML IV EMUL
INTRAVENOUS | Status: AC
  Filled 2018-01-22: qty 50

## 2018-01-22 MED ORDER — BUPIVACAINE LIPOSOME 1.3 % IJ SUSP
INTRAMUSCULAR | Status: AC
Start: 2018-01-22 — End: ?
  Filled 2018-01-22: qty 20

## 2018-01-22 MED ORDER — HYDROMORPHONE HCL 1 MG/ML IJ SOLN: 0.5000 mg | INTRAMUSCULAR | Status: DC | PRN

## 2018-01-22 MED ORDER — CEFAZOLIN SODIUM-DEXTROSE 2-4 GM/100ML-% IV SOLN
INTRAVENOUS | Status: AC
  Filled 2018-01-22: qty 100

## 2018-01-22 MED ORDER — LISINOPRIL 20 MG PO TABS
40.0000 mg | ORAL_TABLET | Freq: Every evening | ORAL | Status: DC
  Filled 2018-01-22 (×2): qty 2

## 2018-01-22 MED ORDER — ONDANSETRON HCL 4 MG PO TABS: 4.0000 mg | ORAL_TABLET | Freq: Four times a day (QID) | ORAL | Status: DC | PRN

## 2018-01-22 MED ORDER — NEOMYCIN-POLYMYXIN B GU 40-200000 IR SOLN
Status: DC | PRN
  Administered 2018-01-22: 14 mL

## 2018-01-22 SURGICAL SUPPLY — 55 items
BLADE SAGITTAL WIDE XTHICK NO (BLADE) ×3 IMPLANT
BLADE SURG SZ20 CARB STEEL (BLADE) ×3 IMPLANT
CANISTER SUCT 1200ML W/VALVE (MISCELLANEOUS) ×3 IMPLANT
CANISTER SUCT 3000ML PPV (MISCELLANEOUS) ×6 IMPLANT
CHLORAPREP W/TINT 26ML (MISCELLANEOUS) ×3 IMPLANT
DRAPE IMP U-DRAPE 54X76 (DRAPES) ×3 IMPLANT
DRAPE INCISE IOBAN 66X60 STRL (DRAPES) ×3 IMPLANT
DRAPE SHEET LG 3/4 BI-LAMINATE (DRAPES) ×3 IMPLANT
DRAPE SURG 17X11 SM STRL (DRAPES) ×6 IMPLANT
DRAPE TABLE BACK 80X90 (DRAPES) ×3 IMPLANT
DRSG OPSITE POSTOP 4X10 (GAUZE/BANDAGES/DRESSINGS) ×3 IMPLANT
DRSG OPSITE POSTOP 4X8 (GAUZE/BANDAGES/DRESSINGS) ×3 IMPLANT
ELECT BLADE 6.5 EXT (BLADE) ×3 IMPLANT
ELECT CAUTERY BLADE 6.4 (BLADE) ×3 IMPLANT
GLOVE BIO SURGEON STRL SZ7.5 (GLOVE) ×12 IMPLANT
GLOVE BIO SURGEON STRL SZ8 (GLOVE) ×12 IMPLANT
GLOVE BIOGEL PI IND STRL 8 (GLOVE) ×1 IMPLANT
GLOVE BIOGEL PI INDICATOR 8 (GLOVE) ×2
GLOVE INDICATOR 8.0 STRL GRN (GLOVE) ×3 IMPLANT
GOWN STRL REUS W/ TWL LRG LVL3 (GOWN DISPOSABLE) ×1 IMPLANT
GOWN STRL REUS W/ TWL XL LVL3 (GOWN DISPOSABLE) ×1 IMPLANT
GOWN STRL REUS W/TWL LRG LVL3 (GOWN DISPOSABLE) ×2
GOWN STRL REUS W/TWL XL LVL3 (GOWN DISPOSABLE) ×2
HEAD CERAMIC DELTA 36MM -3 (Hips) ×3 IMPLANT
HOOD PEEL AWAY FLYTE STAYCOOL (MISCELLANEOUS) ×9 IMPLANT
KIT TURNOVER KIT A (KITS) ×3 IMPLANT
LINER ACETABULAR G7 SZ36 E (Liner) ×3 IMPLANT
NDL SAFETY ECLIPSE 18X1.5 (NEEDLE) ×2 IMPLANT
NEEDLE FILTER BLUNT 18X 1/2SAF (NEEDLE) ×2
NEEDLE FILTER BLUNT 18X1 1/2 (NEEDLE) ×1 IMPLANT
NEEDLE HYPO 18GX1.5 SHARP (NEEDLE) ×4
NEEDLE SPNL 20GX3.5 QUINCKE YW (NEEDLE) ×3 IMPLANT
PACK HIP PROSTHESIS (MISCELLANEOUS) ×3 IMPLANT
PILLOW ABDUC SM (MISCELLANEOUS) ×3 IMPLANT
PIN STEINMAN 3/16 (PIN) ×3 IMPLANT
PIN STEINMANN 3/16X9 BAY 6PK (Pin) ×1 IMPLANT
PULSAVAC PLUS IRRIG FAN TIP (DISPOSABLE) ×3
SHELL ACETABULAR 3H G7 (Shell) ×3 IMPLANT
SOL .9 NS 3000ML IRR  AL (IV SOLUTION) ×2
SOL .9 NS 3000ML IRR UROMATIC (IV SOLUTION) ×1 IMPLANT
SPONGE LAP 18X18 5 PK (GAUZE/BANDAGES/DRESSINGS) IMPLANT
ST PIN 3/16X9 BAY 6PK (Pin) ×3 IMPLANT
STAPLER SKIN PROX 35W (STAPLE) ×3 IMPLANT
STEM FEM COLLARLESS 10X130X130 (Stem) ×3 IMPLANT
SUT TICRON 2-0 30IN 311381 (SUTURE) ×9 IMPLANT
SUT VIC AB 0 CT1 36 (SUTURE) ×3 IMPLANT
SUT VIC AB 1 CT1 36 (SUTURE) ×6 IMPLANT
SUT VIC AB 2-0 CT1 (SUTURE) ×12 IMPLANT
SYR 10ML LL (SYRINGE) ×3 IMPLANT
SYR 20CC LL (SYRINGE) ×3 IMPLANT
SYR 30ML LL (SYRINGE) ×9 IMPLANT
TAPE TRANSPORE STRL 2 31045 (GAUZE/BANDAGES/DRESSINGS) ×3 IMPLANT
TIP FAN IRRIG PULSAVAC PLUS (DISPOSABLE) ×1 IMPLANT
TRAY FOLEY MTR SLVR 16FR STAT (SET/KITS/TRAYS/PACK) ×3 IMPLANT
WATER STERILE IRR 1000ML POUR (IV SOLUTION) ×3 IMPLANT

## 2018-01-22 NOTE — H&P (Signed)
Paper H&P to be scanned into permanent record. H&P reviewed and patient re-examined. No changes. 
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
## 2018-01-22 NOTE — Anesthesia Post-op Follow-up Note (Signed)
Anesthesia QCDR form completed.        

## 2018-01-22 NOTE — Anesthesia Procedure Notes (Signed)
Spinal  Patient location during procedure: OR Staffing Anesthesiologist: Molli Barrows, MD Resident/CRNA: Rolla Plate, CRNA Other anesthesia staff: Wilber Bihari, RN Performed: resident/CRNA and other anesthesia staff  Preanesthetic Checklist Completed: patient identified, site marked, surgical consent, pre-op evaluation, timeout performed, IV checked, risks and benefits discussed and monitors and equipment checked Spinal Block Patient position: sitting Prep: ChloraPrep and site prepped and draped Patient monitoring: heart rate, continuous pulse ox, blood pressure and cardiac monitor Approach: midline Location: L4-5 Injection technique: single-shot Needle Needle type: Introducer and Pencan  Needle gauge: 24 G Needle length: 9 cm Additional Notes Negative paresthesia. Negative blood return. Positive free-flowing CSF. Expiration date of kit checked and confirmed. Patient tolerated procedure well, without complications.

## 2018-01-22 NOTE — Discharge Instructions (Signed)
Instructions after Total Hip Replacement     J. Jeffrey Poggi, M.D.  J. Lance Alva Broxson, PA-C     Dept. of Orthopaedics & Sports Medicine  [facility]  [address]  [place], [place]  [address]  Phone: [phone]   Fax: [phone]    DIET: . Drink plenty of non-alcoholic fluids. . Resume your normal diet. Include foods high in fiber.  ACTIVITY:  . You may use crutches or a walker with weight-bearing as tolerated, unless instructed otherwise. . You may be weaned off of the walker or crutches by your Physical Therapist.  . Do NOT reach below the level of your knees or cross your legs until allowed.    . Continue doing gentle exercises. Exercising will reduce the pain and swelling, increase motion, and prevent muscle weakness.   . Please continue to use the TED compression stockings for 6 weeks. You may remove the stockings at night, but should reapply them in the morning. . Do not drive or operate any equipment until instructed.  WOUND CARE:  . Continue to use ice packs periodically to reduce pain and swelling. . Keep the incision clean and dry. . You may bathe or shower after the staples are removed at the first office visit following surgery.  MEDICATIONS: . You may resume your regular medications. . Please take the pain medication as prescribed on the medication. . Do not take pain medication on an empty stomach. . You have been given a prescription for a blood thinner to prevent blood clots. Please take the medication as instructed. (NOTE: After completing a 2 week course of Lovenox, take one Enteric-coated aspirin once a day.) . Pain medications and iron supplements can cause constipation. Use a stool softener (Senokot or Colace) on a daily basis and a laxative (dulcolax or miralax) as needed. . Do not drive or drink alcoholic beverages when taking pain medications.  CALL THE OFFICE FOR: . Temperature above 101 degrees . Excessive bleeding or drainage on the  dressing. . Excessive swelling, coldness, or paleness of the toes. . Persistent nausea and vomiting.  FOLLOW-UP:  . You should have an appointment to return to the office in 2 weeks after surgery. . Arrangements have been made for continuation of Physical Therapy (either home therapy or outpatient therapy).  

## 2018-01-22 NOTE — Evaluation (Signed)
Physical Therapy Evaluation Patient Details Name: Abigail Tran MRN: [id number] DOB: 31-May-1951 Today's Date: 01/22/2018   History of Present Illness  Patient is a 67 year old female admitted following a R posterior THA.  PMH includes stroke, Htn, DM and arthritis.  Clinical Impression  Pt is a 67 year old female who lives in a one story house with her son and daughter-in-law.  Pt is independent with use of a RW at baseline.  She is in bed and reports pain of 6/10 upon PT arrival.  Pt is able to perform bed mobility mod I and sit at EOB with good balance.  Pt demonstrates good strength in UE's and LE's with exception of area affected by surgery.  She is able to perform a STS supervision and ambulate 40 ft in room with min VC's for safety and management of RW as pt sometimes demonstrates impulsivity with movement.  PT educated pt concerning HEP, prevention of DVT's and posterior hip precautions.  Pt expressed understanding.  Pt will benefit from skilled PT with focus on strength, safe use of AD, functional mobility and pain management.    Follow Up Recommendations Home health PT    Equipment Recommendations  None recommended by PT    Recommendations for Other Services       Precautions / Restrictions Precautions Precautions: Posterior Hip Restrictions Weight Bearing Restrictions: Yes RLE Weight Bearing: Weight bearing as tolerated      Mobility  Bed Mobility Overal bed mobility: Modified Independent             General bed mobility comments: Increased time and use of bed rail.  Transfers Overall transfer level: Needs assistance Equipment used: Rolling walker (2 wheeled) Transfers: Sit to/from Stand Sit to Stand: Supervision         General transfer comment: Able to perform transfer without physical assist.  PT provided min VC's for hand placement and for safety.  Ambulation/Gait Ambulation/Gait assistance: Supervision Ambulation Distance (Feet): 40 Feet Assistive  device: Rolling walker (2 wheeled)     Gait velocity interpretation: 1.31 - 2.62 ft/sec, indicative of limited community ambulator General Gait Details: step through gait pattern, moderate foot clearance, good management of RW with tendency to push RW forward at times. PT provided gait training and VC's for slower gait and safety.   Stairs            Wheelchair Mobility    Modified Rankin (Stroke Patients Only)       Balance Overall balance assessment: Modified Independent                                           Pertinent Vitals/Pain Pain Assessment: 0-10 Pain Score: 6  Pain Location: R hip Pain Descriptors / Indicators: Stabbing Pain Intervention(s): Limited activity within patient's tolerance;Monitored during session    Home Living Family/patient expects to be discharged to:: Private residence Living Arrangements: Children Available Help at Discharge: Family;Available 24 hours/day Type of Home: House Home Access: Level entry     Home Layout: One level Home Equipment: Walker - 2 wheels      Prior Function Level of Independence: Independent with assistive device(s)               Hand Dominance        Extremity/Trunk Assessment   Upper Extremity Assessment Upper Extremity Assessment: Overall WFL for tasks assessed(Grossly 4/5 bilaterally.)  Lower Extremity Assessment Lower Extremity Assessment: Overall WFL for tasks assessed(L: grossly 4+/5, R: grossly 4/5)    Cervical / Trunk Assessment Cervical / Trunk Assessment: Normal  Communication      Cognition Arousal/Alertness: Awake/alert Behavior During Therapy: WFL for tasks assessed/performed Overall Cognitive Status: Within Functional Limits for tasks assessed                                 General Comments: A&O x4 and able to follow directions consistently.      General Comments      Exercises Other Exercises Other Exercises: Educated pt concerning  exercises to perform initially and pt demonstrated understanding of ankle pumps, LAQ's, hip abduction and heel slides.   Assessment/Plan    PT Assessment Patient needs continued PT services  PT Problem List Decreased mobility;Decreased strength;Decreased range of motion;Decreased activity tolerance;Decreased knowledge of use of DME;Decreased balance;Decreased safety awareness       PT Treatment Interventions DME instruction;Therapeutic activities;Gait training;Therapeutic exercise;Patient/family education;Stair training;Balance training;Neuromuscular re-education;Functional mobility training    PT Goals (Current goals can be found in the Care Plan section)  Acute Rehab PT Goals Patient Stated Goal: To return home and regain strength so that she can clean her house. PT Goal Formulation: With patient Time For Goal Achievement: 02/05/18 Potential to Achieve Goals: Good    Frequency BID   Barriers to discharge        Co-evaluation               AM-PAC PT "6 Clicks" Daily Activity  Outcome Measure Difficulty turning over in bed (including adjusting bedclothes, sheets and blankets)?: A Little Difficulty moving from lying on back to sitting on the side of the bed? : A Little Difficulty sitting down on and standing up from a chair with arms (e.g., wheelchair, bedside commode, etc,.)?: A Little Help needed moving to and from a bed to chair (including a wheelchair)?: A Little Help needed walking in hospital room?: A Little Help needed climbing 3-5 steps with a railing? : A Little 6 Click Score: 18    End of Session Equipment Utilized During Treatment: Gait belt Activity Tolerance: Patient tolerated treatment well Patient left: in chair;with call bell/phone within reach;with chair alarm set Nurse Communication: Precautions PT Visit Diagnosis: Muscle weakness (generalized) (M62.81);Pain Pain - Right/Left: Right Pain - part of body: Hip    Time: 1500-1530 PT Time Calculation  (min) (ACUTE ONLY): 30 min   Charges:   PT Evaluation $PT Eval Low Complexity: 1 Low PT Treatments $Therapeutic Activity: 8-22 mins   PT G Codes:   PT G-Codes **NOT FOR INPATIENT CLASS** Functional Assessment Tool Used: AM-PAC 6 Clicks Basic Mobility    Glenetta Hew, PT, DPT   Glenetta Hew 01/22/2018, 4:00 PM

## 2018-01-22 NOTE — Op Note (Signed)
01/22/2018  9:48 AM  Patient:   Abigail Tran  Pre-Op Diagnosis:   Degenerative joint disease, right hip.  Post-Op Diagnosis:   Same.  Procedure:   Right total hip arthroplasty.  Surgeon:   Maryagnes Amos, MD  Assistant:   Horris Latino, PA-C  Anesthesia:   Spinal  Findings:   As above.  Complications:   None  EBL:   200 cc  Fluids:   600 cc crystalloid  UOP:   200 cc  TT:   None  Drains:   None  Closure:   Staples  Implants:   Biomet press-fit system with a #10 laterally offset Echo femoral stem, a 52 mm acetabular shell with an E-poly hi-wall liner, and a 36 mm ceramic head with a -3 mm neck.  Brief Clinical Note:   The patient is a 67 year old female with a long history of gradually worsening right hip pain. Her symptoms have progressed despite medications, activity modification, etc. Her history and examination consistent with degenerative joint disease confirmed by plain radiographs. The patient recently has undergone a left total hip arthroplasty from which she is done quite well. She presents at this time for a right total hip arthroplasty.   Procedure:   The patient was brought into the operating room. After adequate spinal anesthesia was obtained, a Foley catheter was inserted by the nurse before the patient was repositioned in the left lateral decubitus position and secured using a lateral hip positioner. The right hip and lower extremity were prepped with ChloroPrep solution before being draped sterilely. Preoperative antibiotics were administered. A timeout was performed to verify the appropriate surgical site before a standard posterior approach to the hip was made through an approximately 4-5 inch incision. The incision was carried down through the subcutaneous tissues to expose the gluteal fascia and proximal end of the iliotibial band. These structures were split the length of the incision and the Charnley self-retaining hip retractor placed. The bursal tissues  were swept posteriorly to expose the short external rotators. The anterior border of the piriformis tendon was identified and this plane developed down through the capsule to enter the joint. A flap of tissue was elevated off the posterior aspect of the femoral neck and greater trochanter and retracted posteriorly. This flap included the piriformis tendon, the short external rotators, and the posterior capsule. The soft tissues were elevated off the lateral aspect of the ilium and a large Steinmann pin placed bicortically. With the right leg aligned over the left, a drill bit was placed into the greater trochanter parallel to the Steinmann pin and the distance between these two pins measured in order to optimize leg lengths postoperatively. The drill bit was removed and the hip dislocated. The piriformis fossa was debrided of soft tissues before the intramedullary canal was accessed through this point using a triple step reamer. The canal was reamed sequentially beginning with a #7 tapered reamer and progressing to a #10 tapered reamer. This provided excellent circumferential chatter. Using the appropriate guide, a femoral neck cut was made 10-12 mm above the lesser trochanter. The femoral head was removed.  Attention was directed to the acetabular side. The labrum was debrided circumferentially before the ligamentum teres was removed using a large curette. A line was drawn on the drapes corresponding to the native version of the acetabulum. This line was used as a guide while the acetabulum was reamed sequentially beginning with a 47 mm reamer and progressing to a 51 mm reamer. This provided  excellent circumferential chatter. The 51 mm trial acetabulum was positioned and found to fit quite well. Therefore, the 52 mm acetabular shell was selected and impacted into place with care taken to maintain the appropriate version. The trial high wall liner was inserted.  Attention was redirected to the femoral side. A  box osteotome was used to establish version before the canal was broached sequentially beginning with a #7 broach and progressing to a #10 broach. This was left in place and several trial reductions performed using both a standard and laterally offset neck options, as well as the -3 and +0 mm neck lengths. The permanent E-polyethylene hi-wall liner was impacted into the acetabular shell and its locking mechanism verified using a quarter-inch osteotome. Next, the #10 lateral offset femoral stem was impacted into place with care taken to maintain the appropriate version. A repeat trial reduction was performed using the -3 mm and +0 mm neck lengths. The -3 mm neck length demonstrated excellent stability both in extension and external rotation as well as with flexion to 90 and internal rotation beyond 70. It also was stable in the position of sleep. In addition, leg lengths appeared to be restored appropriately, both by reassessing the position of the right leg over the left, as well as by measuring the distance between the Steinmann pin and the drill bit. The 36 mm ceramic head with the -3 mm neck adapter construct was put together on the back table before being impacted onto the stem of the femoral component. The Morse taper locking mechanism was verified using manual distraction before the head was relocated and placed through a range of motion with the findings as described above.  The wound was copiously irrigated with bacitracin saline solution via the jet lavage system before the peri-incisional and pericapsular tissues were injected with 30 cc of 0.5% Sensorcaine with epinephrine and 20 cc of Exparel diluted out to 60 cc with normal saline to help with postoperative analgesia. The posterior flap was reapproximated to the posterior aspect of the greater trochanter using #2 Tycron interrupted sutures placed through drill holes. Several additional #2 Tycron interrupted sutures were used to reinforce this layer  of closure. The iliotibial band was reapproximated using #1 Vicryl interrupted sutures before the gluteal fascia was closed using a running #1 Vicryl suture. At this point, 1 g of transexemic acid in 10 cc of normal saline was injected into the joint to help reduce postoperative bleeding. The subcutaneous tissues were closed in several layers using 2-0 Vicryl interrupted sutures before the skin was closed using staples. A sterile occlusive dressing was applied to the wound before the patient was placed into an abduction wedge pillow. The patient was then rolled back into the supine position on his/her hospital bed before being awakened and returned to the recovery room in satisfactory condition after tolerating the procedure well.

## 2018-01-22 NOTE — NC FL2 (Signed)
[place] MEDICAID FL2 LEVEL OF CARE SCREENING TOOL     IDENTIFICATION  Patient Name: Abigail Tran Birthdate: 1950-12-13 Sex: female Admission Date (Current Location): 01/22/2018  [place] and [place] Number:  Chiropodist and Address:  [facility], [address] [place], [place] [address]      Provider Number: [id number]  Attending Physician Name and Address:  Christena Flake, MD  Relative Name and Phone Number:       Current Level of Care: Hospital Recommended Level of Care: Skilled Nursing Facility Prior Approval Number:    Date Approved/Denied:   PASRR Number: [id number])  Discharge Plan: SNF    Current Diagnoses: Patient Active Problem List   Diagnosis Date Noted  . 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 Status post total hip replacement, left 10/30/2017    Orientation RESPIRATION BLADDER Height & Weight     Self, Time, Situation, Place  Normal Continent Weight: 186 lb (84.4 kg) Height:   (167.6 cm)  BEHAVIORAL SYMPTOMS/MOOD NEUROLOGICAL BOWEL NUTRITION STATUS      Continent Diet(Diet: Clear Liquid to be Advanced. )  AMBULATORY STATUS COMMUNICATION OF NEEDS Skin   Extensive Assist Verbally Surgical wounds(Incision: Right Hip. )                       Personal Care Assistance Level of Assistance  Bathing, Feeding, Dressing Bathing Assistance: Limited assistance Feeding assistance: Independent Dressing Assistance: Limited assistance     Functional Limitations Info  Sight, Hearing, Speech Sight Info: Adequate Hearing Info: Adequate Speech Info: Adequate    SPECIAL CARE FACTORS FREQUENCY  PT (By licensed PT), OT (By licensed OT)     PT Frequency: (5) OT Frequency: (5)            Contractures      Additional Factors Info  Code Status, Allergies Code Status Info: (Full Code. ) Allergies Info: (No Known Allergies. )           Current Medications (01/22/2018):  This is the  current hospital active medication list Current Facility-Administered Medications  Medication Dose Route Frequency Provider Last Rate Last Dose  . 0.9 %  sodium chloride infusion   Intravenous Continuous Poggi, Excell Seltzer, MD 75 mL/hr at 01/22/18 1132    . acetaminophen (TYLENOL) tablet 1,000 mg  1,000 mg Oral Q6H Poggi, Excell Seltzer, MD   1,000 mg at 01/22/18 1228  . [START ON 01/23/2018] acetaminophen (TYLENOL) tablet 325-650 mg  325-650 mg Oral Q6H PRN Poggi, Excell Seltzer, MD      . amLODipine (NORVASC) tablet 10 mg  10 mg Oral QPM Poggi, Excell Seltzer, MD      . Aspirin-Salicylamide-Caffeine [phone] MG TABS   Oral Daily PRN Poggi, Excell Seltzer, MD      . atorvastatin (LIPITOR) tablet 40 mg  40 mg Oral QPM Poggi, Excell Seltzer, MD      . bisacodyl (DULCOLAX) suppository 10 mg  10 mg Rectal Daily PRN Poggi, Excell Seltzer, MD      . Melene Muller ON 01/23/2018] carvedilol (COREG) tablet 6.25 mg  6.25 mg Oral Q breakfast Poggi, Excell Seltzer, MD      . ceFAZolin (ANCEF) IVPB 2g/100 mL premix  2 g Intravenous Q6H Poggi, Excell Seltzer, MD      . diphenhydrAMINE (BENADRYL) 12.5 MG/5ML elixir 12.5-25 mg  12.5-25 mg Oral Q4H PRN Poggi, Excell Seltzer, MD      . docusate sodium (COLACE) capsule 100 mg  100 mg Oral BID Christena Flake, MD      . Melene Muller ON 01/23/2018] enoxaparin (LOVENOX) injection 40 mg  40 mg Subcutaneous Q24H Poggi, Excell Seltzer, MD      . famotidine (PEPCID) 20 MG tablet           . HYDROmorphone (DILAUDID) injection 0.5-1 mg  0.5-1 mg Intravenous Q4H PRN Poggi, Excell Seltzer, MD      . ketorolac (TORADOL) 15 MG/ML injection 15 mg  15 mg Intravenous Q6H Poggi, Excell Seltzer, MD      . lisinopril (PRINIVIL,ZESTRIL) tablet 40 mg  40 mg Oral QPM Poggi, Excell Seltzer, MD      . magnesium hydroxide (MILK OF MAGNESIA) suspension 30 mL  30 mL Oral Daily PRN Poggi, Excell Seltzer, MD      . metoCLOPramide (REGLAN) tablet 5-10 mg  5-10 mg Oral Q8H PRN Poggi, Excell Seltzer, MD       Or  . metoCLOPramide (REGLAN) injection 5-10 mg  5-10 mg Intravenous Q8H PRN Poggi, Excell Seltzer, MD      . ondansetron (ZOFRAN)  tablet 4 mg  4 mg Oral Q6H PRN Poggi, Excell Seltzer, MD       Or  . ondansetron (ZOFRAN) injection 4 mg  4 mg Intravenous Q6H PRN Poggi, Excell Seltzer, MD      . oxyCODONE (Oxy IR/ROXICODONE) immediate release tablet 5-10 mg  5-10 mg Oral Q4H PRN Poggi, Excell Seltzer, MD   5 mg at 01/22/18 1240  . pantoprazole (PROTONIX) EC tablet 40 mg  40 mg Oral Daily Poggi, Excell Seltzer, MD      . sodium phosphate (FLEET) 7-19 GM/118ML enema 1 enema  1 enema Rectal Once PRN Poggi, Excell Seltzer, MD      . traMADol Janean Sark) tablet 50 mg  50 mg Oral Q6H PRN Poggi, Excell Seltzer, MD         Discharge Medications: Please see discharge summary for a list of discharge medications.  Relevant Imaging Results:  Relevant Lab Results:   Additional Information (SSN: [id number])  Raeven Pint, Darleen Crocker, LCSW

## 2018-01-22 NOTE — Transfer of Care (Signed)
Immediate Anesthesia Transfer of Care Note  Patient: Abigail Tran  Procedure(s) Performed: TOTAL HIP ARTHROPLASTY (Right Hip)  Patient Location: PACU  Anesthesia Type:Spinal  Level of Consciousness: awake, alert  and oriented  Airway & Oxygen Therapy: Patient Spontanous Breathing and Patient connected to face mask oxygen  Post-op Assessment: Report given to RN and Post -op Vital signs reviewed and stable  Post vital signs: Reviewed  Last Vitals:  Vitals Value Taken Time  BP 103/69 01/22/2018  9:56 AM  Temp 36.5 C 01/22/2018  9:50 AM  Pulse 76 01/22/2018  9:56 AM  Resp 18 01/22/2018  9:56 AM  SpO2 100 % 01/22/2018  9:56 AM  Vitals shown include unvalidated device data.  Last Pain:  Vitals:   01/22/18 0952  TempSrc:   PainSc: (P) 0-No pain         Complications: No apparent anesthesia complications

## 2018-01-22 NOTE — Discharge Summary (Addendum)
Physician Discharge Summary  Patient ID: Abigail Tran MRN: [id number] DOB/AGE: 03/06/1951 67 y.o.  Admit date: 01/22/2018 Discharge date: 01/24/18  Admission Diagnoses:  primary osteoarthritis of right hip  Discharge Diagnoses: Patient Active Problem List   Diagnosis Date Noted  . 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 Status post total hip replacement, left 10/30/2017    Past Medical History:  Diagnosis Date  . Arthritis   . Borderline diabetes   . Hypertension   . Stroke [facility]) age 46   temporary facial drooping on the right     Transfusion: None   Consultants (if any):   Discharged Condition: Improved  Hospital Course: Abigail Tran is an 67 y.o. female who was admitted 01/22/2018 with a diagnosis of primary osteoarthritis of the right hip and went to the operating room on 01/22/2018 and underwent the above named procedures.  The patient did well on postop day 1.  She ambulated 180 feet with physical therapy.  She was rated to discharge home on postop day 2   Surgeries: Procedure(s): TOTAL HIP ARTHROPLASTY on 01/22/2018 Patient tolerated the surgery well. Taken to PACU where she was stabilized and then transferred to the orthopedic floor.  Started on Lovenox  q 24 hrs. Foot pumps applied bilaterally at 80 mm. Heels elevated on bed with rolled towels. No evidence of DVT. Negative Homan. Physical therapy started on day #1 for gait training and transfer. OT started day #1 for ADL and assisted devices.  Patient's IV was removed on POD1.  Foley catheter was removed shortly following surgery.  Implants: Biomet press-fit system with a #10 laterally offset Echo femoral stem, a 52 mm acetabular shell with an E-poly hi-wall liner, and a 36 mm ceramic head with a -3 mm neck.  She was given perioperative antibiotics:  Anti-infectives (From admission, onward)   Start     Dose/Rate Route Frequency Ordered Stop   01/22/18 1400  ceFAZolin (ANCEF) IVPB 2g/100 mL  premix     2 g 200 mL/hr over 30 Minutes Intravenous Every 6 hours 01/22/18 1124 01/23/18 0321   01/22/18 0629  ceFAZolin (ANCEF) 2-4 GM/100ML-% IVPB    Note to Pharmacy:  Hallaji, Violet   : cabinet override      01/22/18 0629 01/22/18 0754   01/22/18 0115  ceFAZolin (ANCEF) IVPB 2g/100 mL premix     2 g 200 mL/hr over 30 Minutes Intravenous  Once 01/22/18 0111 01/22/18 0804    .  She was given sequential compression devices, early ambulation, and lovenox for DVT prophylaxis.  She benefited maximally from the hospital stay and there were no complications.    Recent vital signs:  Vitals:   01/23/18 1838 01/23/18 2345  BP: (!) 109/55 (!) 151/69  Pulse:  92  Resp:  17  Temp:  (!) 100.7 F (38.2 C)  SpO2:  95%    Recent laboratory studies:  Lab Results  Component Value Date   HGB 9.4 (L) 01/24/2018   HGB 9.7 (L) 01/23/2018   HGB 11.6 (L) 01/22/2018   Lab Results  Component Value Date   WBC 7.6 01/24/2018   PLT 242 01/24/2018   Lab Results  Component Value Date   INR 0.93 01/09/2018   Lab Results  Component Value Date   NA 136 01/24/2018   K 3.7 01/24/2018   CL 106 01/24/2018   CO2 25 01/24/2018   BUN 11 01/24/2018   CREATININE 0.59 01/24/2018   GLUCOSE 117 (H) 01/24/2018  Discharge Medications:   Allergies as of 01/24/2018   No Known Allergies     Medication List    TAKE these medications   amLODipine 10 MG tablet Commonly known as:  NORVASC Take 10 mg by mouth every evening.   atorvastatin 40 MG tablet Commonly known as:  LIPITOR Take 40 mg by mouth every evening.   BC HEADACHE PO Take 1 packet by mouth daily as needed (pain).   carvedilol 3.125 MG tablet Commonly known as:  COREG Take 6.25 mg by mouth daily.   enoxaparin 40 MG/0.4ML injection Commonly known as:  LOVENOX Inject 0.4 mLs (40 mg total) into the skin daily.   ibuprofen 200 MG tablet Commonly known as:  ADVIL,MOTRIN Take 400 mg by mouth daily as needed for headache or  moderate pain.   lisinopril 40 MG tablet Commonly known as:  PRINIVIL,ZESTRIL Take 40 mg by mouth every evening.   meloxicam 15 MG tablet Commonly known as:  MOBIC Take 15 mg by mouth every evening.   naproxen sodium 220 MG tablet Commonly known as:  ALEVE Take 440 mg by mouth daily as needed (pain).   oxyCODONE 5 MG immediate release tablet Commonly known as:  Oxy IR/ROXICODONE Take 1-2 tablets (5-10 mg total) by mouth every 4 (four) hours as needed for moderate pain (pain score 4-6).            Durable Medical Equipment  (From admission, onward)        Start     Ordered   01/23/18 1338  For home use only DME Bedside commode  Once    Question:  Patient needs a bedside commode to treat with the following condition  Answer:  Weakness   01/23/18 1337      Diagnostic Studies: Dg Chest 2 View  Result Date: 01/09/2018 CLINICAL DATA:  Pre operative respiratory exam. Severe arthritis of the right hip. EXAM: CHEST - 2 VIEW COMPARISON:  Radiographs dated 11/01/2017 and 10/22/2017 FINDINGS: The heart size and mediastinal contours are within normal limits. Both lungs are clear. The visualized skeletal structures are unremarkable. IMPRESSION: Normal exam. Electronically Signed   By: Francene Boyers M.D.   On: 01/09/2018 15:34   Dg Hip Unilat W Or W/o Pelvis 2-3 Views Right  Result Date: 01/22/2018 CLINICAL DATA:  Right hip replacement. EXAM: DG HIP (WITH OR WITHOUT PELVIS) 2-3V RIGHT COMPARISON:  No recent prior. FINDINGS: REPLACEMENT.: FINDINGS: REPLACEMENT. Total right hip replacement anatomic alignment. Hardware intact. Prior total left hip IMPRESSION: Total right hip replacement anatomic alignment. Electronically Signed   By: Maisie Fus  Register   On: 01/22/2018 10:35   Disposition: Plan will be for discharge home on 01/24/18.  Follow-up Information    Anson [place], PA-C Follow up in 14 day(s).   Specialty:  Physician Assistant Why:  Mindi Slicker  information: [address] [place] [address] [phone]          Signed: Lenard Forth, Ridhima Golberg PA-C 01/24/2018, 6:43 AM

## 2018-01-22 NOTE — Care Management Note (Signed)
Case Management Note  Patient Details  Name: Abigail Tran MRN: [id number] Date of Birth: 11-Aug-1951  Subjective/Objective:   POD # 0 right THA. Met with patient and her daughter at bedside to discuss discharge planning. Patient to go home with PT. Offered a list of home care providers. [facility] prefers [facility]. Referral to [facility] for PT. She will need a bsc. She has a walker. [facility]acy: [facility] [address]: [phone]. Cll Lovenox 40 mg # 14 no refills .  Will check cost of Lovenox prior to discharge.                 Action/Plan: [facility] for  PT. [facility] for BSC.   Expected Discharge Date:                  Expected Discharge Plan:  [place]  In-House Referral:     Discharge planning Services  CM Consult  Post Acute Care Choice:  Durable Medical Equipment, Home Health Choice offered to:  Patient  DME Arranged:  Bedside commode DME Agency:  [place]:  PT [place]:  [facility]  Status of Service:  In process, will continue to follow  If discussed at Long Length of Stay Meetings, dates discussed:    Additional Comments:  [name], RN 01/22/2018, 4:16 PM

## 2018-01-23 LAB — BASIC METABOLIC PANEL
ANION GAP: 6 (ref 5–15)
BUN: 10 mg/dL (ref 6–20)
CO2: 27 mmol/L (ref 22–32)
Calcium: 8.3 mg/dL — ABNORMAL LOW (ref 8.9–10.3)
Chloride: 102 mmol/L (ref 101–111)
Creatinine, Ser: 0.85 mg/dL (ref 0.44–1.00)
GFR calc Af Amer: >60 mL/min (ref >60)
GFR calc non Af Amer: >60 mL/min (ref >60)
GLUCOSE: 135 mg/dL — AB (ref 65–99)
POTASSIUM: 3.8 mmol/L (ref 3.5–5.1)
Sodium: 135 mmol/L (ref 135–145)

## 2018-01-23 LAB — CBC WITH DIFFERENTIAL/PLATELET
Basophils Absolute: 0.1 10*3/uL (ref 0–0.1)
Basophils Relative: 2 %
EOS ABS: 0.2 10*3/uL (ref 0–0.7)
EOS PCT: 3 %
HCT: 28.5 % — ABNORMAL LOW (ref 35.0–47.0)
Hemoglobin: 9.7 g/dL — ABNORMAL LOW (ref 12.0–16.0)
LYMPHS ABS: 1.2 10*3/uL (ref 1.0–3.6)
LYMPHS PCT: 18 %
MCH: 28.3 pg (ref 26.0–34.0)
MCHC: 33.9 g/dL (ref 32.0–36.0)
MCV: 83.4 fL (ref 80.0–100.0)
MONOS PCT: 6 %
Monocytes Absolute: 0.4 10*3/uL (ref 0.2–0.9)
Neutro Abs: 4.9 10*3/uL (ref 1.4–6.5)
Neutrophils Relative %: 73 %
Platelets: 250 10*3/uL (ref 150–440)
RBC: 3.42 MIL/uL — AB (ref 3.80–5.20)
RDW: 15.5 % — AB (ref 11.5–14.5)
WBC: 6.7 10*3/uL (ref 3.6–11.0)

## 2018-01-23 NOTE — Progress Notes (Signed)
Physical Therapy Treatment Patient Details Name: Abigail Tran MRN: [id number] DOB: [birth year] Today's Date: 01/23/2018    History of Present Illness Patient is a 67 year old female admitted following a R posterior THA.  PMH includes stroke, Htn, DM and arthritis.    PT Comments    Pt able to progress ambulation to 100 feet with RW SBA.  Pain R hip 2-3/10 during session.  Overall tolerated mobility and LE ex's well.  Did require review of posterior hip precautions initially during session.  Will continue to progress pt with strengthening and progressive mobility per pt tolerance.    Follow Up Recommendations  Home health PT     Equipment Recommendations  Rolling walker with 5" wheels    Recommendations for Other Services       Precautions / Restrictions Precautions Precautions: Posterior Hip Precaution Booklet Issued: Yes (comment) Restrictions Weight Bearing Restrictions: Yes RLE Weight Bearing: Weight bearing as tolerated    Mobility  Bed Mobility Overal bed mobility: Modified Independent             General bed mobility comments: Semi-supine to sit with mild increased effort and time to perform on own.  Transfers Overall transfer level: Needs assistance Equipment used: Rolling walker (2 wheeled) Transfers: Sit to/from Stand Sit to Stand: Supervision         General transfer comment: initial vc's for posterior hip precautions with transfers required but then pt able to perform on own without any further cueing (x1 repetition from bed and x1 repetition from recliner chair)  Ambulation/Gait Ambulation/Gait assistance: Supervision Ambulation Distance (Feet): 100 Feet Assistive device: Rolling walker (2 wheeled)   Gait velocity: decreased   General Gait Details: partial step through gait pattern; decreased stance time R LE; vc's for gait technique/pattern required   Stairs             Wheelchair Mobility    Modified Rankin (Stroke Patients  Only)       Balance Overall balance assessment: Needs assistance Sitting-balance support: No upper extremity supported;Feet supported Sitting balance-Leahy Scale: Normal Sitting balance - Comments: steady sitting reaching outside BOS (within hip precautions)   Standing balance support: Single extremity supported Standing balance-Leahy Scale: Poor Standing balance comment: requires at least single UE support for standing reaching within BOS                            Cognition Arousal/Alertness: Awake/alert Behavior During Therapy: WFL for tasks assessed/performed Overall Cognitive Status: Within Functional Limits for tasks assessed                                 General Comments: A&O x4 and able to follow directions consistently.      Exercises Total Joint Exercises Ankle Circles/Pumps: AROM;Strengthening;Both;10 reps;Supine Quad Sets: AROM;Strengthening;Both;10 reps;Supine Gluteal Sets: AROM;Strengthening;Both;10 reps;Supine Towel Squeeze: AROM;Strengthening;Both;10 reps;Supine Short Arc Quad: AROM;Strengthening;Right;10 reps;Supine Heel Slides: AAROM;Strengthening;Right;10 reps;Supine Hip ABduction/ADduction: AAROM;Strengthening;Right;10 reps;Supine    General Comments General comments (skin integrity, edema, etc.): R hip dressing in place with drainage noted (within marker outline).  Nursing cleared pt for participation in physical therapy.  Pt agreeable to PT session.      Pertinent Vitals/Pain Pain Assessment: 0-10 Pain Score: 2  Pain Location: R hip Pain Descriptors / Indicators: Sore Pain Intervention(s): Limited activity within patient's tolerance;Monitored during session;Premedicated before session;Repositioned;Other (comment)(pt declined ice pack)  Vitals (HR and O2  on room air) stable and WFL throughout treatment session.    Home Living                      Prior Function            PT Goals (current goals can now be  found in the care plan section) Acute Rehab PT Goals Patient Stated Goal: To return home and regain strength so that she can clean her house. PT Goal Formulation: With patient Time For Goal Achievement: 02/05/18 Potential to Achieve Goals: Good Progress towards PT goals: Progressing toward goals    Frequency    BID      PT Plan Current plan remains appropriate    Co-evaluation              AM-PAC PT "6 Clicks" Daily Activity  Outcome Measure  Difficulty turning over in bed (including adjusting bedclothes, sheets and blankets)?: A Little Difficulty moving from lying on back to sitting on the side of the bed? : A Little Difficulty sitting down on and standing up from a chair with arms (e.g., wheelchair, bedside commode, etc,.)?: Unable Help needed moving to and from a bed to chair (including a wheelchair)?: A Little Help needed walking in hospital room?: A Little Help needed climbing 3-5 steps with a railing? : A Little 6 Click Score: 16    End of Session Equipment Utilized During Treatment: Gait belt Activity Tolerance: Patient tolerated treatment well Patient left: in chair;with call bell/phone within reach;with chair alarm set;with SCD's reapplied;Other (comment)(pillows placed between pt's knees for posterior hip precautions and B heels elevated via pillows) Nurse Communication: Mobility status;Precautions;Weight bearing status PT Visit Diagnosis: Muscle weakness (generalized) (M62.81);Pain;Other abnormalities of gait and mobility (R26.89) Pain - Right/Left: Right Pain - part of body: Hip     Time: 1010-1048 PT Time Calculation (min) (ACUTE ONLY): 38 min  Charges:  $Gait Training: 8-22 mins $Therapeutic Exercise: 8-22 mins $Therapeutic Activity: 8-22 mins                    G CodesHendricks Limes, PT 01/23/18, 11:15 AM [phone]

## 2018-01-23 NOTE — Progress Notes (Signed)
Clinical Social Worker (CSW) received SNF consult. PT is recommending home health. RN case manager aware of above. Please reconsult if future social work needs arise. CSW signing off.   Satish Hammers, LCSW [phone] 

## 2018-01-23 NOTE — Progress Notes (Signed)
   Subjective: 1 Day Post-Op Procedure(s) (LRB): TOTAL HIP ARTHROPLASTY (Right) Patient reports pain as 0 on 0-10 scale.   Patient is well, and has had no acute complaints or problems Denies any CP, SOB, ABD pain. We will continue therapy today.  Plan is to go Home after hospital stay.  Objective: Vital signs in last 24 hours: Temp:  [96.9 F (36.1 C)-98.5 F (36.9 C)] 98.5 F (36.9 C) (05/17 0432) Pulse Rate:  [56-87] 66 (05/17 0432) Resp:  [12-19] 18 (05/17 0432) BP: (67-131)/(48-85) 107/58 (05/17 0432) SpO2:  [96 %-100 %] 96 % (05/17 0432)  Intake/Output from previous day: 05/16 0701 - 05/17 0700 In: 3030 [P.O.:720; I.V.:2310] Out: 1010 [Urine:810; Blood:200] Intake/Output this shift: No intake/output data recorded.  Recent Labs    01/22/18 0636 01/23/18 0412  HGB 11.6* 9.7*   Recent Labs    01/22/18 0636 01/23/18 0412  WBC  --  6.7  RBC  --  3.42*  HCT 34.0* 28.5*  PLT  --  250   Recent Labs    01/22/18 0636 01/23/18 0412  NA 142 135  K 3.7 3.8  CL  --  102  CO2  --  27  BUN  --  10  CREATININE  --  0.85  GLUCOSE 111* 135*  CALCIUM  --  8.3*   No results for input(s): LABPT, INR in the last 72 hours.  EXAM General - Patient is Alert, Appropriate and Oriented Extremity - Neurovascular intact Sensation intact distally Intact pulses distally Dorsiflexion/Plantar flexion intact No cellulitis present Compartment soft Dressing - dressing C/D/I and moderate drainage. Drainage marked with pen yesterday, no new drainage. Dressing is dry.  Motor Function - intact, moving foot and toes well on exam.   Past Medical History:  Diagnosis Date  . Arthritis   . Borderline diabetes   . Hypertension   . Stroke [facility]) age 58   temporary facial drooping on the right    Assessment/Plan:   1 Day Post-Op Procedure(s) (LRB): TOTAL HIP ARTHROPLASTY (Right) Active Problems:   Status post total hip replacement, right  Estimated body mass index is 30.02 kg/m  as calculated from the following:   Height as of this encounter:  (1.676 m).   Weight as of this encounter: 84.4 kg (186 lb). Advance diet Up with therapy  Needs BM Acute post op blood loss anemia - Hgb 9.7.  Recheck labs in the am Pain well controlled Vital signs stable CM to assist with discharge to home with HHPT    DVT Prophylaxis - Lovenox, Foot Pumps and TED hose Weight-Bearing as tolerated to right leg   T. Cranston Neighbor, PA-C [facility] 01/23/2018, 8:18 AM

## 2018-01-23 NOTE — Plan of Care (Signed)
  Problem: Education: Goal: Knowledge of General Education information will improve Outcome: Progressing   Problem: Health Behavior/Discharge Planning: Goal: Ability to manage health-related needs will improve Outcome: Progressing   Problem: Clinical Measurements: Goal: Ability to maintain clinical measurements within normal limits will improve Outcome: Progressing Goal: Will remain free from infection Outcome: Progressing Goal: Diagnostic test results will improve Outcome: Progressing Goal: Respiratory complications will improve Outcome: Progressing Goal: Cardiovascular complication will be avoided Outcome: Progressing   Problem: Activity: Goal: Risk for activity intolerance will decrease Outcome: Progressing   Problem: Nutrition: Goal: Adequate nutrition will be maintained Outcome: Progressing   Problem: Coping: Goal: Level of anxiety will decrease Outcome: Progressing   Problem: Elimination: Goal: Will not experience complications related to bowel motility Outcome: Progressing Goal: Will not experience complications related to urinary retention Outcome: Progressing   

## 2018-01-23 NOTE — Anesthesia Postprocedure Evaluation (Signed)
Anesthesia Post Note  Patient: Abigail Tran  Procedure(s) Performed: TOTAL HIP ARTHROPLASTY (Right Hip)  Patient location during evaluation: Nursing Unit Anesthesia Type: Spinal Level of consciousness: awake, awake and alert and oriented Pain management: pain level controlled Vital Signs Assessment: post-procedure vital signs reviewed and stable Respiratory status: spontaneous breathing, nonlabored ventilation and respiratory function stable Cardiovascular status: blood pressure returned to baseline and stable Postop Assessment: no headache and no backache Anesthetic complications: no     Last Vitals:  Vitals:   01/22/18 2341 01/23/18 0432  BP: 127/74 (!) 107/58  Pulse: 87 66  Resp:  18  Temp: 36.7 C 36.9 C  SpO2: 96% 96%    Last Pain:  Vitals:   01/23/18 0601  TempSrc:   PainSc: 3                  Chiropodist

## 2018-01-23 NOTE — Progress Notes (Signed)
Physical Therapy Treatment Patient Details Name: Abigail Tran MRN: [id number] DOB: 04-27-1951 Today's Date: 01/23/2018    History of Present Illness Patient is a 67 year old female admitted following a R posterior THA.  PMH includes stroke, Htn, DM and arthritis.    PT Comments    Pt able to progress to ambulating 180 feet with RW SBA.  Pain 2/10 R hip during session (0/10 end of session resting in chair).  Pt does require cueing for posterior hip precautions with functional mobility intermittently; extra time spent with pt reviewing precautions with improved recall noted.  Will continue to progress pt with strengthening, increasing ambulation distance, and trial stairs as appropriate (pt reports having 1 very small step to enter home).    Follow Up Recommendations  Home health PT     Equipment Recommendations  Rolling walker with 5" wheels    Recommendations for Other Services       Precautions / Restrictions Precautions Precautions: Posterior Hip;Fall Precaution Booklet Issued: Yes (comment) Restrictions Weight Bearing Restrictions: Yes RLE Weight Bearing: Weight bearing as tolerated    Mobility  Bed Mobility               General bed mobility comments: Deferred (pt sitting up in recliner beginning and end of session).  Transfers Overall transfer level: Needs assistance Equipment used: Rolling walker (2 wheeled) Transfers: Sit to/from [employer] Sit to Stand: Supervision Stand pivot transfers: Supervision       General transfer comment: initial vc's for posterior hip precautions with transfers required but then pt able to perform on own without any further cueing (x3 repetitions from recliner chair)  Ambulation/Gait Ambulation/Gait assistance: Supervision Ambulation Distance (Feet): 180 Feet Assistive device: Rolling walker (2 wheeled)   Gait velocity: decreased   General Gait Details: partial step through gait pattern; decreased stance  time R LE; vc's for gait technique/pattern required initially; vc's for upright posture required intermittently   Stairs             Wheelchair Mobility    Modified Rankin (Stroke Patients Only)       Balance Overall balance assessment: Needs assistance Sitting-balance support: No upper extremity supported;Feet supported Sitting balance-Leahy Scale: Normal Sitting balance - Comments: steady sitting reaching outside BOS (within hip precautions)   Standing balance support: Single extremity supported Standing balance-Leahy Scale: Poor Standing balance comment: requires at least single UE support for standing reaching within BOS                            Cognition Arousal/Alertness: Awake/alert Behavior During Therapy: WFL for tasks assessed/performed Overall Cognitive Status: Within Functional Limits for tasks assessed                                 General Comments: A&O x4 and able to follow directions consistently.      Exercises Total Joint Exercises Long Arc Quad: AROM;Strengthening;Both;10 reps;Seated    General Comments        Pertinent Vitals/Pain Pain Assessment: 0-10 Pain Score: 2  Pain Location: R hip Pain Descriptors / Indicators: Sore Pain Intervention(s): Limited activity within patient's tolerance;Monitored during session;Premedicated before session;Repositioned    Home Living                      Prior Function  PT Goals (current goals can now be found in the care plan section) Acute Rehab PT Goals Patient Stated Goal: To return home and regain strength so that she can clean her house. PT Goal Formulation: With patient Time For Goal Achievement: 02/05/18 Potential to Achieve Goals: Good Progress towards PT goals: Progressing toward goals    Frequency    BID      PT Plan Current plan remains appropriate    Co-evaluation              AM-PAC PT "6 Clicks" Daily Activity   Outcome Measure  Difficulty turning over in bed (including adjusting bedclothes, sheets and blankets)?: A Little Difficulty moving from lying on back to sitting on the side of the bed? : A Little Difficulty sitting down on and standing up from a chair with arms (e.g., wheelchair, bedside commode, etc,.)?: Unable Help needed moving to and from a bed to chair (including a wheelchair)?: A Little Help needed walking in hospital room?: A Little Help needed climbing 3-5 steps with a railing? : A Little 6 Click Score: 16    End of Session Equipment Utilized During Treatment: Gait belt Activity Tolerance: Patient tolerated treatment well Patient left: in chair;with call bell/phone within reach;with chair alarm set;with SCD's reapplied;Other (comment)(pillows placed between pt's B knees for posterior hip precautions; B heels elevated via pillows) Nurse Communication: Mobility status;Precautions;Weight bearing status PT Visit Diagnosis: Muscle weakness (generalized) (M62.81);Pain;Other abnormalities of gait and mobility (R26.89) Pain - Right/Left: Right Pain - part of body: Hip     Time: 3086-5784 PT Time Calculation (min) (ACUTE ONLY): 38 min  Charges:  $Gait Training: 8-22 mins $Therapeutic Exercise: 8-22 mins $Therapeutic Activity: 8-22 mins                    G CodesHendricks Limes, PT 01/23/18, 3:19 PM [phone]

## 2018-01-24 LAB — CBC
HCT: 27.8 % — ABNORMAL LOW (ref 35.0–47.0)
Hemoglobin: 9.4 g/dL — ABNORMAL LOW (ref 12.0–16.0)
MCH: 27.9 pg (ref 26.0–34.0)
MCHC: 33.8 g/dL (ref 32.0–36.0)
MCV: 82.6 fL (ref 80.0–100.0)
Platelets: 242 10*3/uL (ref 150–440)
RBC: 3.37 MIL/uL — ABNORMAL LOW (ref 3.80–5.20)
RDW: 14.9 % — AB (ref 11.5–14.5)
WBC: 7.6 10*3/uL (ref 3.6–11.0)

## 2018-01-24 LAB — BASIC METABOLIC PANEL
Anion gap: 5 (ref 5–15)
BUN: 11 mg/dL (ref 6–20)
CALCIUM: 8.1 mg/dL — AB (ref 8.9–10.3)
CO2: 25 mmol/L (ref 22–32)
CREATININE: 0.59 mg/dL (ref 0.44–1.00)
Chloride: 106 mmol/L (ref 101–111)
GFR calc Af Amer: >60 mL/min (ref >60)
Glucose, Bld: 117 mg/dL — ABNORMAL HIGH (ref 65–99)
POTASSIUM: 3.7 mmol/L (ref 3.5–5.1)
SODIUM: 136 mmol/L (ref 135–145)

## 2018-01-24 NOTE — Progress Notes (Signed)
Patient discharging home.Instructions and prescriptions given to patient. Verbalized understanding. Family transporting patient home.

## 2018-01-24 NOTE — Care Management (Signed)
RNCM spoke with patient regarding plan to transition to home today. She will need a bedside commode and I have asked Jermaine with [facility] to delivery. Lovenox price pending with [facility] as they have not opened yet. Patient said [facility] would be supplying her home PT.  I have also notified Laurelyn Sickle of patient discharge.  No other RNCM needs.

## 2018-01-24 NOTE — Progress Notes (Signed)
   Subjective: 2 Days Post-Op Procedure(s) (LRB): TOTAL HIP ARTHROPLASTY (Right) Patient reports pain as 0 on 0-10 scale.   Patient is well, and has had no acute complaints or problems Denies any CP, SOB, ABD pain. We will continue therapy today.  Plan is to go Home after hospital stay.  Objective: Vital signs in last 24 hours: Temp:  [98.1 F (36.7 C)-100.7 F (38.2 C)] 100.7 F (38.2 C) (05/17 2345) Pulse Rate:  [74-92] 92 (05/17 2345) Resp:  [17-18] 17 (05/17 2345) BP: (101-151)/(55-69) 151/69 (05/17 2345) SpO2:  [95 %-99 %] 95 % (05/17 2345)  Intake/Output from previous day: 05/17 0701 - 05/18 0700 In: 1000 [P.O.:1000] Out: 300 [Urine:300] Intake/Output this shift: Total I/O In: 640 [P.O.:640] Out: -   Recent Labs    01/22/18 0636 01/23/18 0412 01/24/18 0331  HGB 11.6* 9.7* 9.4*   Recent Labs    01/23/18 0412 01/24/18 0331  WBC 6.7 7.6  RBC 3.42* 3.37*  HCT 28.5* 27.8*  PLT 250 242   Recent Labs    01/23/18 0412 01/24/18 0331  NA 135 136  K 3.8 3.7  CL 102 106  CO2 27 25  BUN 10 11  CREATININE 0.85 0.59  GLUCOSE 135* 117*  CALCIUM 8.3* 8.1*   No results for input(s): LABPT, INR in the last 72 hours.  EXAM General - Patient is Alert, Appropriate and Oriented Extremity - Neurovascular intact Sensation intact distally Intact pulses distally Dorsiflexion/Plantar flexion intact No cellulitis present Compartment soft Dressing - dressing C/D/I and moderate drainage. Dressing is dry.  Motor Function - intact, moving foot and toes well on exam.  Ambulated 180 feet with physical therapy  Past Medical History:  Diagnosis Date  . Arthritis   . Borderline diabetes   . Hypertension   . Stroke [facility]) age 67   temporary facial drooping on the right    Assessment/Plan:   2 Days Post-Op Procedure(s) (LRB): TOTAL HIP ARTHROPLASTY (Right) Active Problems:   Status post total hip replacement, right  Estimated body mass index is 30.02 kg/m as  calculated from the following:   Height as of this encounter:  (1.676 m).   Weight as of this encounter: 84.4 kg (186 lb). Advance diet Up with therapy  Needs BM Acute post op blood loss anemia - Hgb 9.4.  Pain well controlled Vital signs stable CM to assist with discharge to home with HHPT today   DVT Prophylaxis - Lovenox, Foot Pumps and TED hose Weight-Bearing as tolerated to right leg   Dedra Skeens, PA-C [facility] 01/24/2018, 6:41 AM

## 2018-01-24 NOTE — Progress Notes (Signed)
Physical Therapy Treatment Patient Details Name: Abigail Tran MRN: [id number] DOB: 1951/02/25 Today's Date: 01/24/2018    History of Present Illness Patient is a 67 year old female admitted following a R posterior THA.  PMH includes stroke, Htn, DM and arthritis.    PT Comments    Pt agreeable to PT; reports 3/10 pain in R hip. Pt requires assist for RLE only this morning to mobilize out of bed. Education required for posterior hip precautions to remember and apply to functional mobility. Pt demonstrates supervision with transfers and ambulation with cues for upright posture. No LOB. Pt participates in RLE exercises with assist as needed. Pt able to demonstrate proper sequence for negotiating step (pt has one small step to enter home). Pt has no voiced concerns regarding return home. Pt to continue with HHPT once discharged later today.    Follow Up Recommendations  Home health PT     Equipment Recommendations  (received rw)    Recommendations for Other Services       Precautions / Restrictions Precautions Precautions: Posterior Hip;Fall Restrictions Weight Bearing Restrictions: Yes RLE Weight Bearing: Weight bearing as tolerated    Mobility  Bed Mobility Overal bed mobility: Needs Assistance Bed Mobility: Supine to Sit     Supine to sit: Min assist     General bed mobility comments: for RLE  Transfers Overall transfer level: Needs assistance Equipment used: Rolling walker (2 wheeled) Transfers: Sit to/from Stand Sit to Stand: Supervision         General transfer comment: cues for adhering to posterior hip precautions. Education on RLE placement and proper wt shift  Ambulation/Gait Ambulation/Gait assistance: Supervision Ambulation Distance (Feet): 100 Feet Assistive device: Rolling walker (2 wheeled) Gait Pattern/deviations: Step-through pattern;Trunk flexed   Gait velocity interpretation: <1.8 ft/sec, indicate of risk for recurrent falls General Gait  Details: slow, steady. Good R foot position. Encouraged more upright stance   Stairs Stairs: Yes Stairs assistance: Min guard Stair Management: One rail Left Number of Stairs: 1 General stair comments: Pt knows correct sequence; only has 1 small step to negotiate at home   Wheelchair Mobility    Modified Rankin (Stroke Patients Only)       Balance Overall balance assessment: Needs assistance Sitting-balance support: Feet supported Sitting balance-Leahy Scale: Good     Standing balance support: Bilateral upper extremity supported Standing balance-Leahy Scale: Fair                              Cognition Arousal/Alertness: Awake/alert Behavior During Therapy: WFL for tasks assessed/performed Overall Cognitive Status: Within Functional Limits for tasks assessed                                        Exercises Total Joint Exercises Ankle Circles/Pumps: AROM;Both;20 reps Quad Sets: Strengthening;Both;10 reps Gluteal Sets: Strengthening;Both;10 reps Heel Slides: AAROM;Right;10 reps(partial range) Hip ABduction/ADduction: AAROM;Right;10 reps Long Arc Quad: AAROM;Right;10 reps Other Exercises Other Exercises: posterior hip precautions with functional mobility    General Comments        Pertinent Vitals/Pain Pain Assessment: 0-10 Pain Score: 3  Pain Location: R hip Pain Descriptors / Indicators: Sore Pain Intervention(s): Monitored during session;Premedicated before session;Repositioned    Home Living                      Prior Function  PT Goals (current goals can now be found in the care plan section) Progress towards PT goals: Progressing toward goals    Frequency    BID      PT Plan Current plan remains appropriate    Co-evaluation              AM-PAC PT "6 Clicks" Daily Activity  Outcome Measure  Difficulty turning over in bed (including adjusting bedclothes, sheets and blankets)?:  Unable Difficulty moving from lying on back to sitting on the side of the bed? : Unable Difficulty sitting down on and standing up from a chair with arms (e.g., wheelchair, bedside commode, etc,.)?: A Little Help needed moving to and from a bed to chair (including a wheelchair)?: A Little Help needed walking in hospital room?: A Little Help needed climbing 3-5 steps with a railing? : A Little 6 Click Score: 14    End of Session Equipment Utilized During Treatment: Gait belt Activity Tolerance: Patient tolerated treatment well Patient left: in chair;with call bell/phone within reach;with chair alarm set   PT Visit Diagnosis: Muscle weakness (generalized) (M62.81);Pain;Other abnormalities of gait and mobility (R26.89) Pain - Right/Left: Right Pain - part of body: Hip     Time: 1004-1036 PT Time Calculation (min) (ACUTE ONLY): 32 min  Charges:  $Gait Training: 8-22 mins $Therapeutic Exercise: 8-22 mins                    G Codes:         Scot Dock, PTA 01/24/2018, 10:46 AM

## 2018-01-26 ENCOUNTER — Telehealth: Payer: Self-pay

## 2018-01-26 LAB — SURGICAL PATHOLOGY

## 2018-01-26 NOTE — Telephone Encounter (Signed)
EMMI Follow-up: Abigail Tran had received a call from my number and just following up.  I explained our new process of the automated calls post discharge to see if there were any concerns.  Ms. Fiallos said everything was going well.  Had f/u appointment made and picked up Rxs.  No concerns at this time.

## 2019-04-06 IMAGING — DX DG HIP (WITH OR WITHOUT PELVIS) 2-3V*L*
2 series · 2 of 2 positions shown · non-contrast
Comparison: None.

CLINICAL DATA: Status post left hip arthroplasty

EXAM:
DG HIP (WITH OR WITHOUT PELVIS) 2-3V LEFT

[pelvis ap]
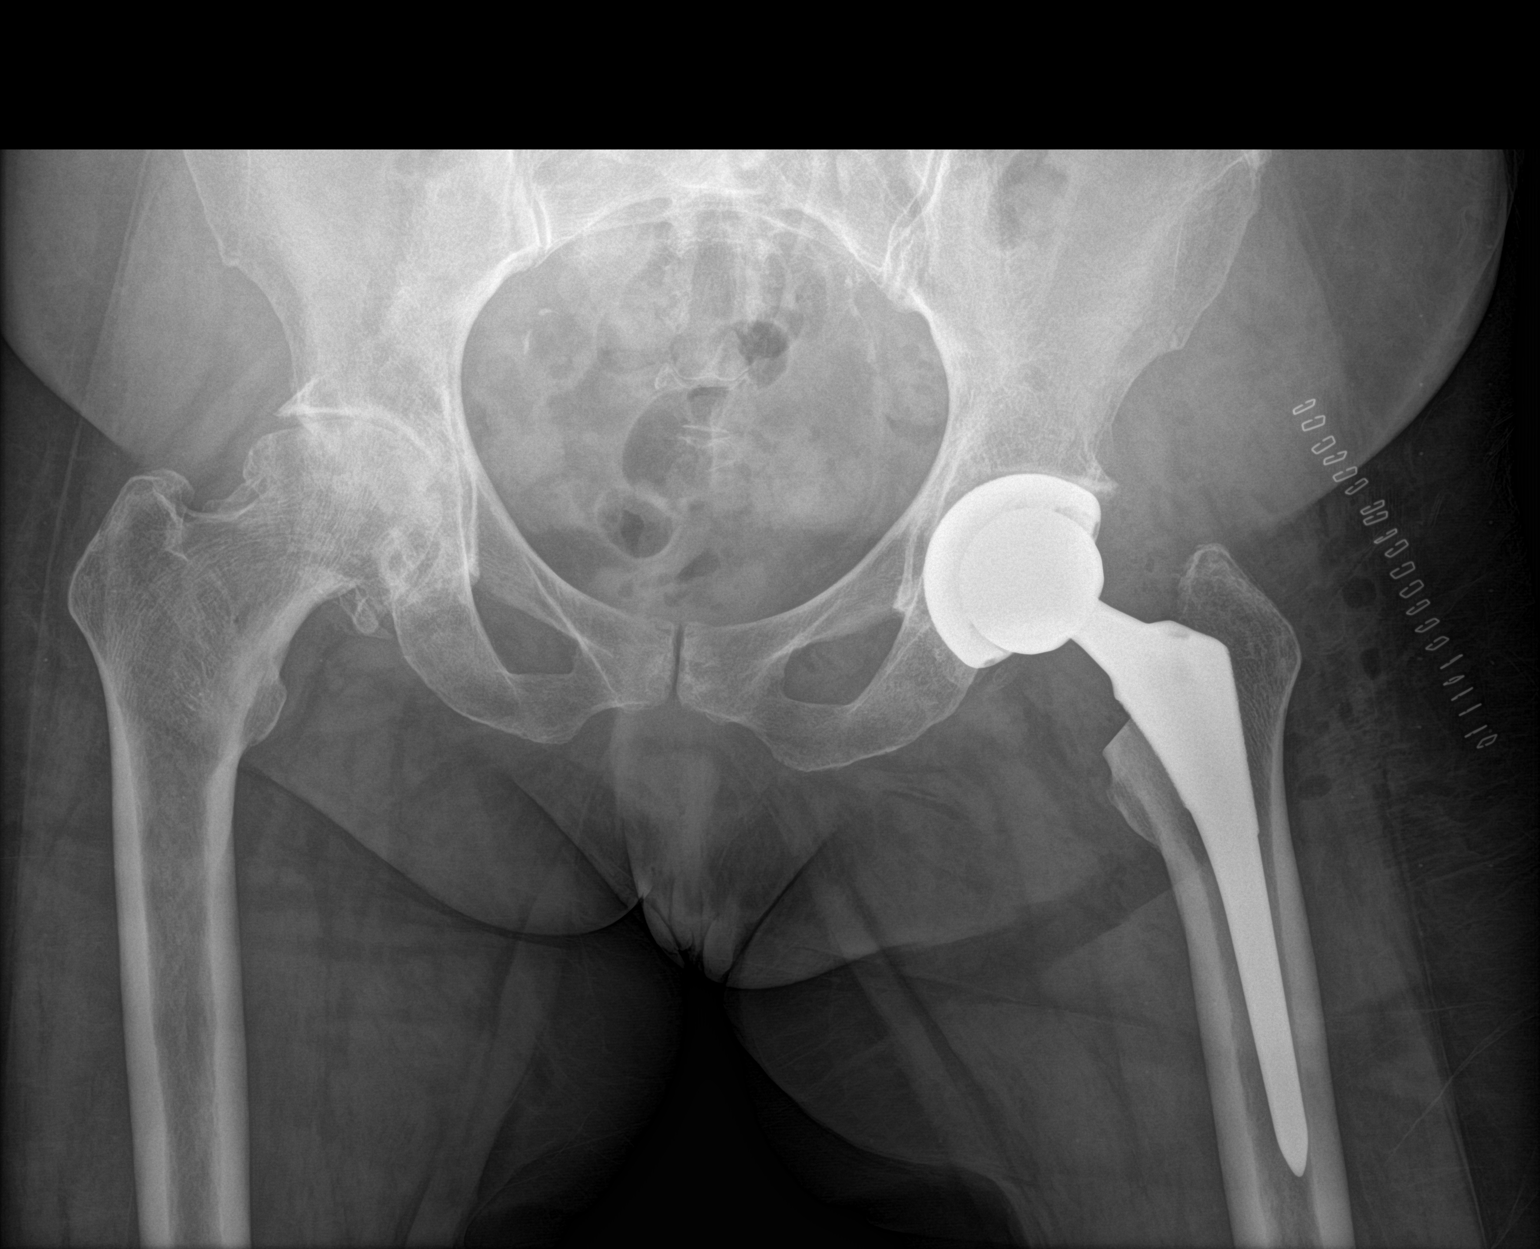

[hip lat]
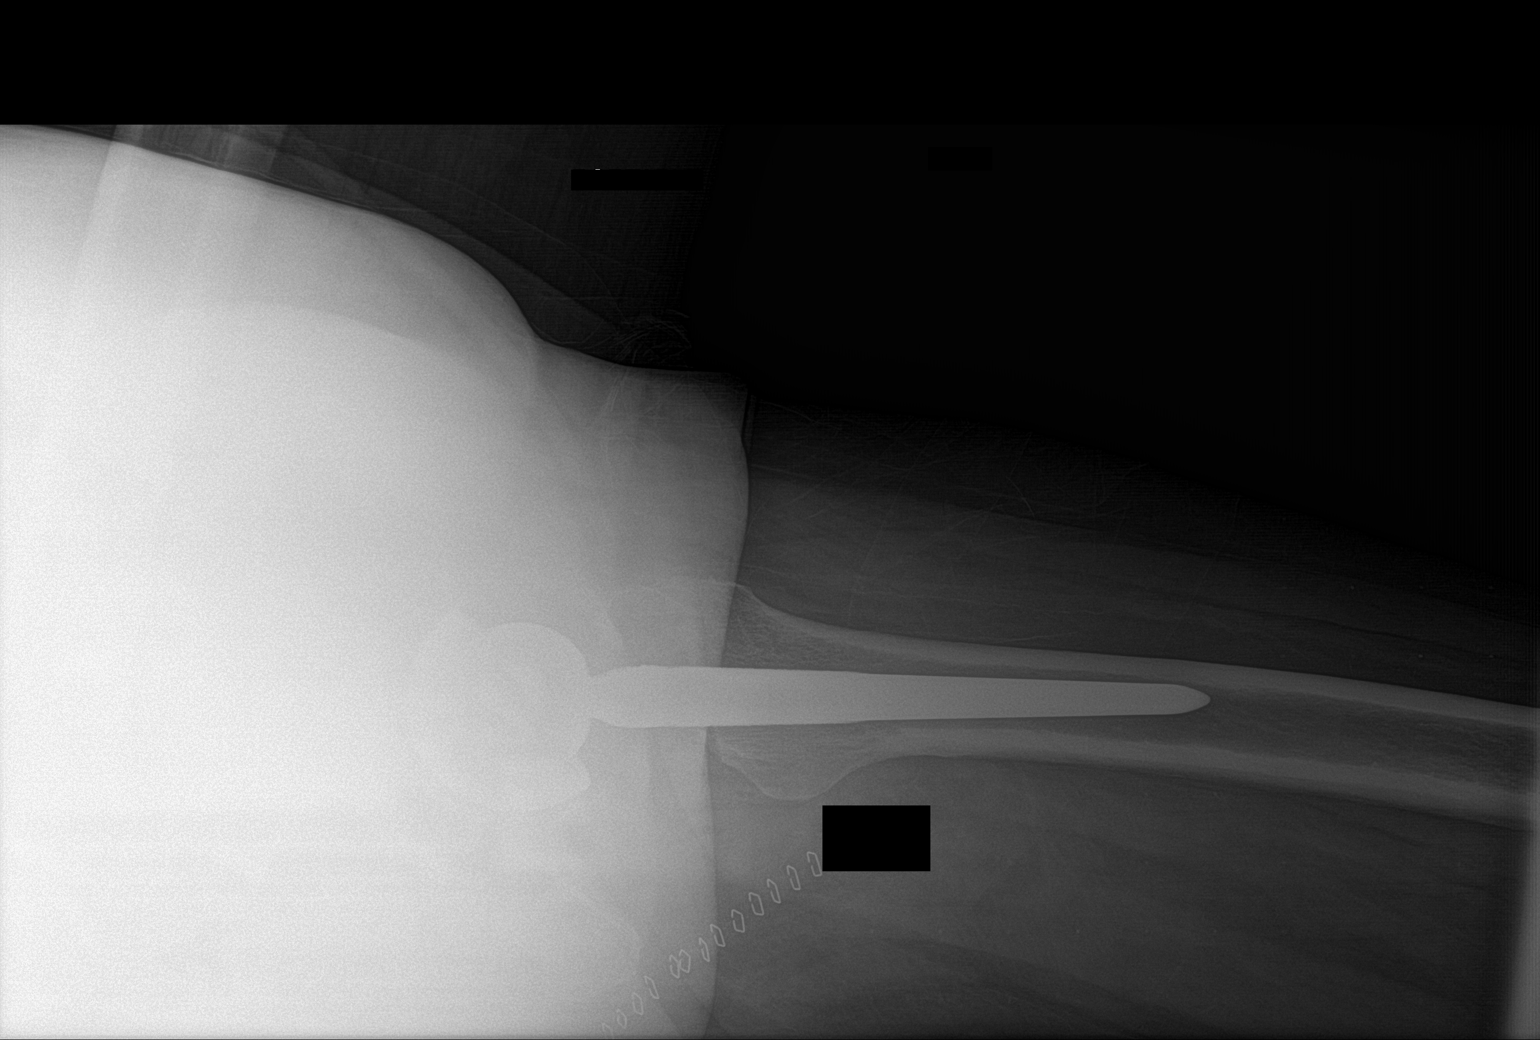

[2 of 2 positions shown; findings below may reference images not displayed]

FINDINGS: Changes of left hip replacement. No hardware bony complicating
feature. Normal alignment. Advanced degenerative changes in the
right hip.
IMPRESSION: Left hip replacement.  No complicating feature.

## 2019-05-11 ENCOUNTER — Other Ambulatory Visit: Payer: Self-pay | Admitting: Family Medicine

## 2019-05-11 DIAGNOSIS — Z1231 Encounter for screening mammogram for malignant neoplasm of breast: Secondary | ICD-10-CM

## 2019-05-27 ENCOUNTER — Other Ambulatory Visit: Payer: Self-pay

## 2019-05-27 ENCOUNTER — Encounter (INDEPENDENT_AMBULATORY_CARE_PROVIDER_SITE_OTHER): Payer: Self-pay

## 2019-05-27 ENCOUNTER — Ambulatory Visit
Admission: RE | Admit: 2019-05-27 | Discharge: 2019-05-27 | Disposition: A | Discharge status: Home / Self Care | Payer: Medicare HMO | Source: Ambulatory Visit | Attending: Family Medicine | Admitting: Family Medicine

## 2019-05-27 DIAGNOSIS — Z1231 Encounter for screening mammogram for malignant neoplasm of breast: Secondary | ICD-10-CM | POA: Diagnosis not present

## 2019-06-29 IMAGING — DX DG HIP (WITH OR WITHOUT PELVIS) 2-3V*R*
2 series · 3 of 3 positions shown · non-contrast
Comparison: No recent prior.

CLINICAL DATA: Right hip replacement.

EXAM:
DG HIP (WITH OR WITHOUT PELVIS) 2-3V RIGHT

[pelvis ap]
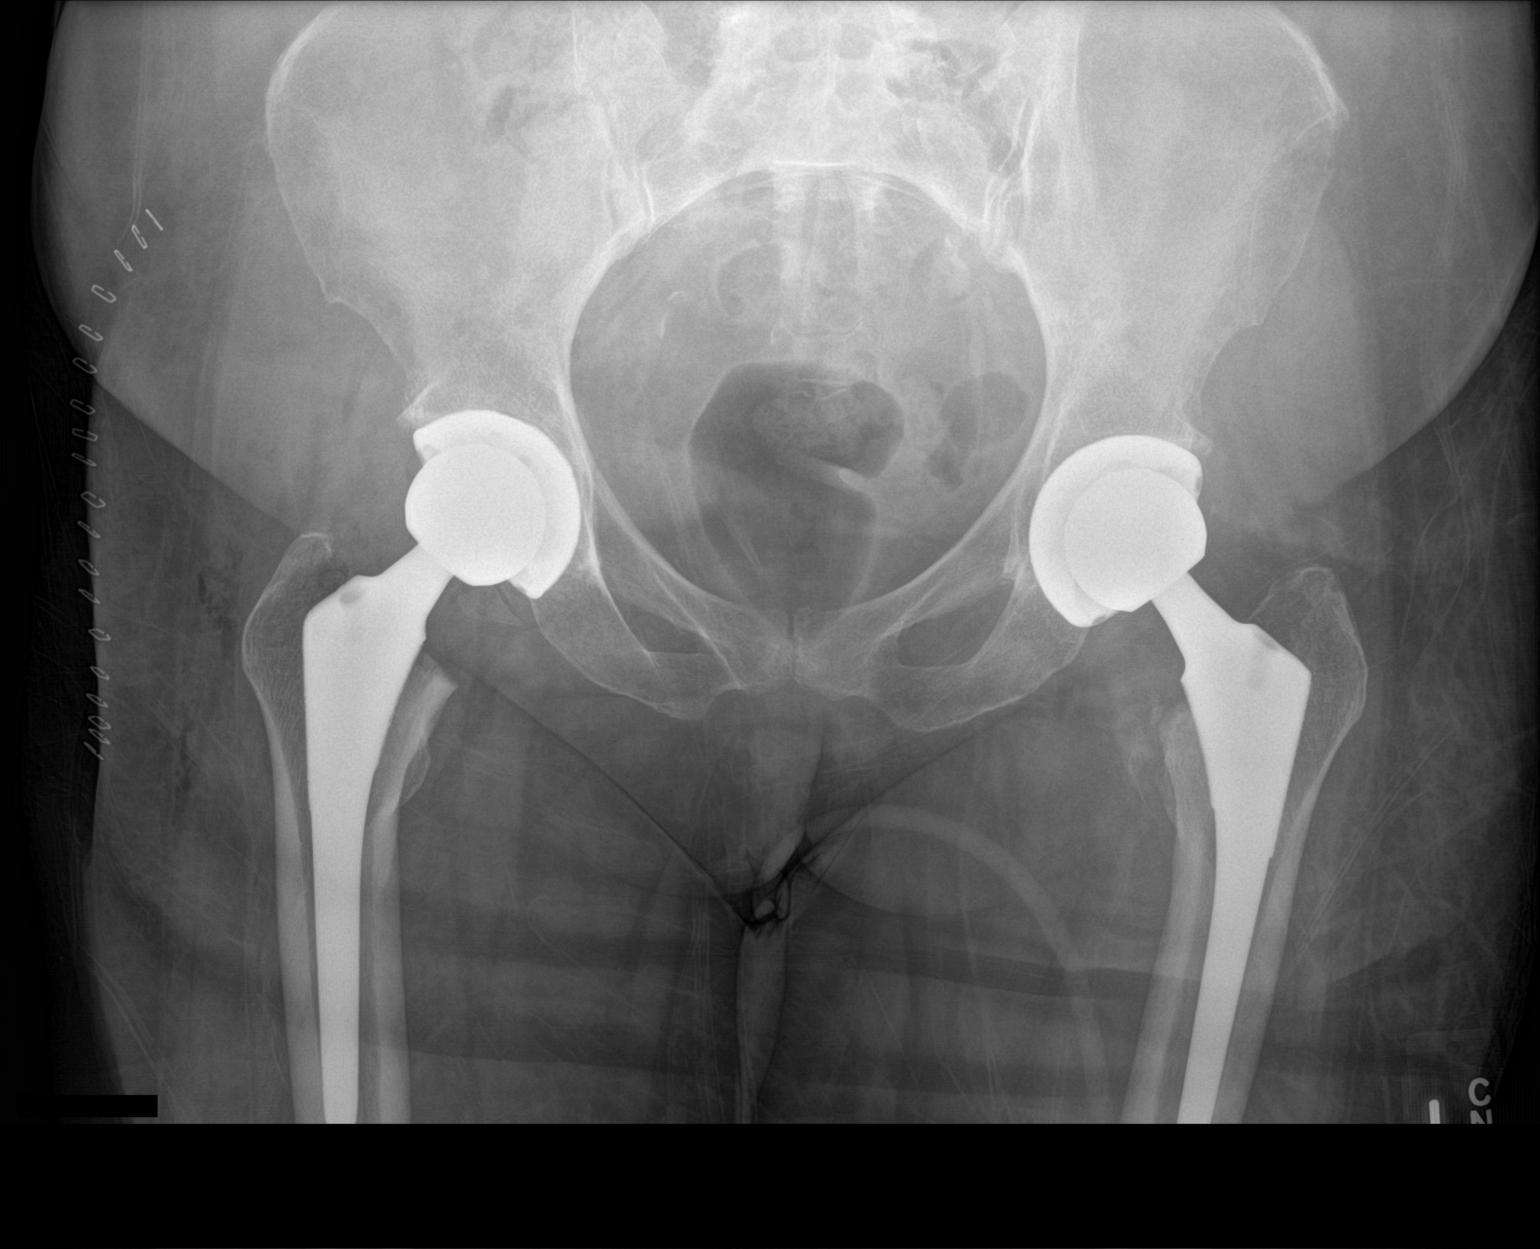

[Series 3: hip lat · 0.14mm/px · 2 of 2 slices shown]
[im 1/2]
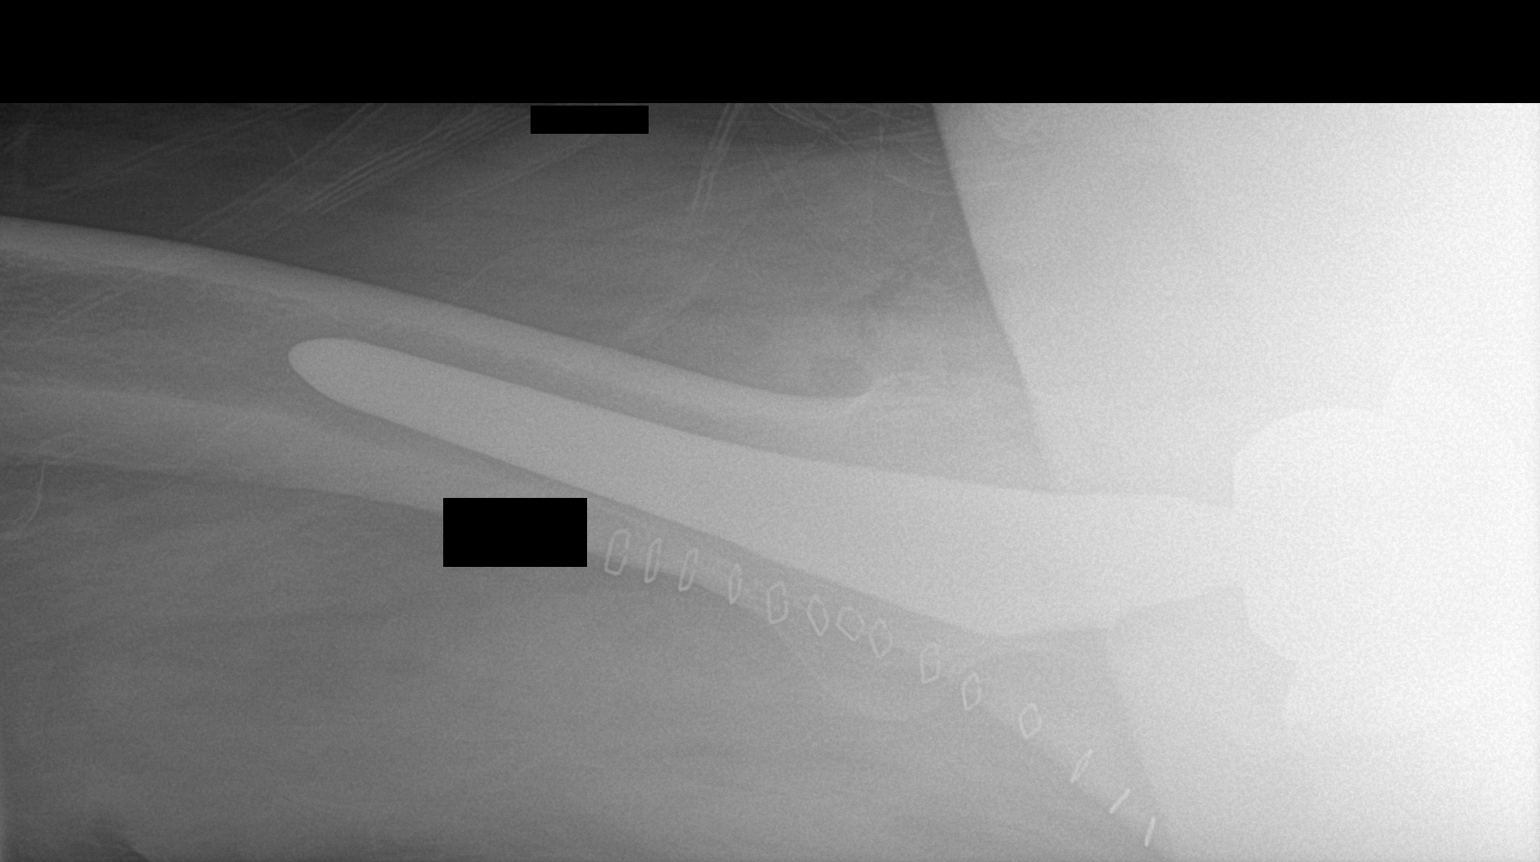
[im 2/2]
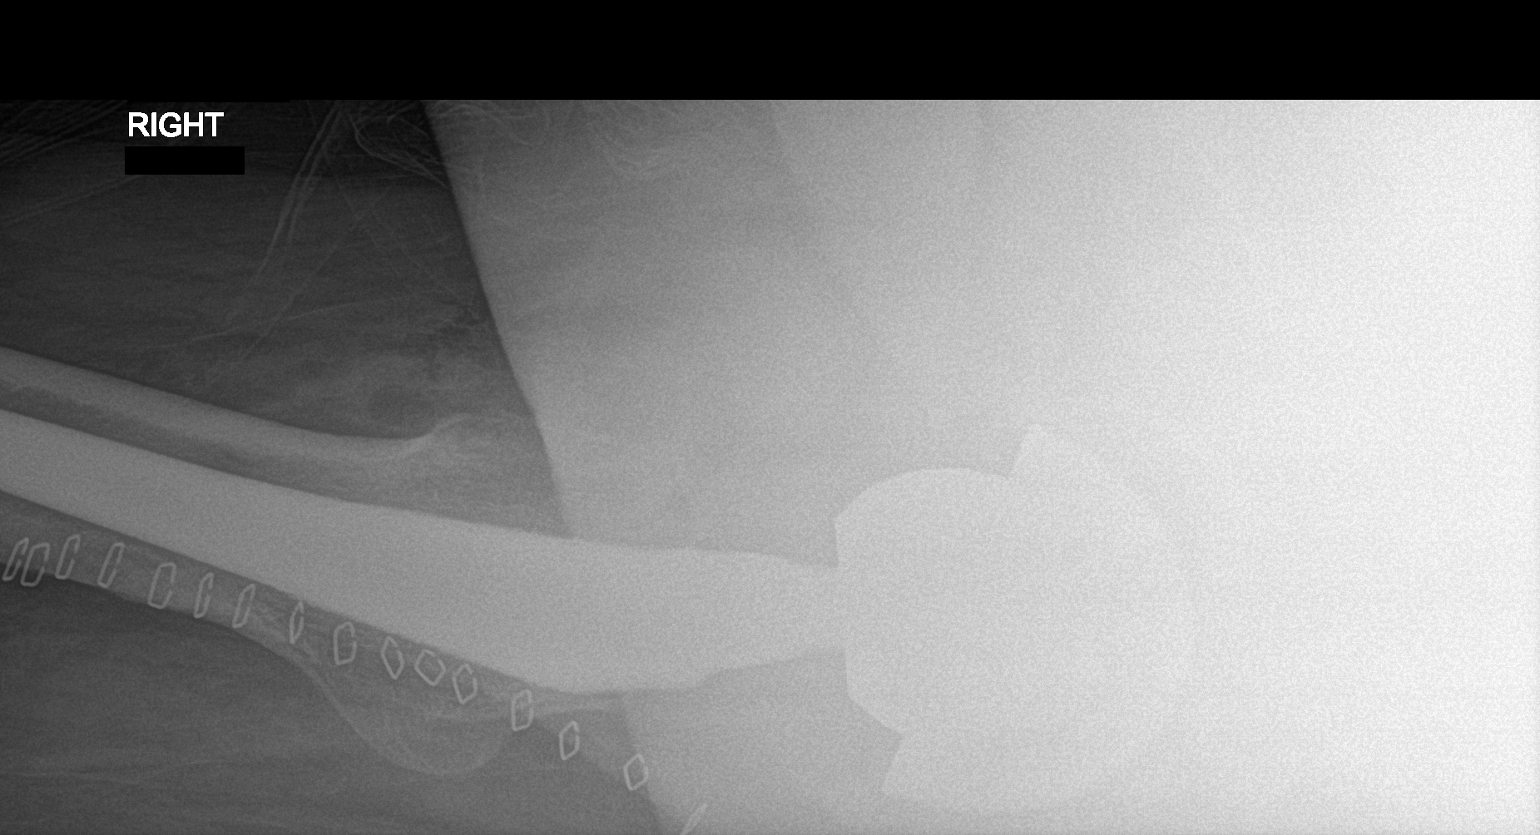

[3 of 3 positions shown; findings below may reference images not displayed]

FINDINGS: REPLACEMENT.:
FINDINGS: REPLACEMENT.
Total right hip replacement anatomic alignment. Hardware intact.
Prior total left hip
IMPRESSION: Total right hip replacement anatomic alignment.

## 2019-08-13 ENCOUNTER — Other Ambulatory Visit: Payer: Self-pay

## 2019-08-13 DIAGNOSIS — R5383 Other fatigue: Secondary | ICD-10-CM | POA: Diagnosis not present

## 2019-08-13 DIAGNOSIS — G4700 Insomnia, unspecified: Secondary | ICD-10-CM | POA: Diagnosis not present

## 2019-08-13 DIAGNOSIS — E860 Dehydration: Secondary | ICD-10-CM | POA: Insufficient documentation

## 2019-08-13 DIAGNOSIS — I10 Essential (primary) hypertension: Secondary | ICD-10-CM | POA: Diagnosis not present

## 2019-08-13 DIAGNOSIS — E876 Hypokalemia: Secondary | ICD-10-CM | POA: Diagnosis not present

## 2019-08-13 DIAGNOSIS — Z87891 Personal history of nicotine dependence: Secondary | ICD-10-CM | POA: Insufficient documentation

## 2019-08-13 DIAGNOSIS — Z79899 Other long term (current) drug therapy: Secondary | ICD-10-CM | POA: Diagnosis not present

## 2019-08-13 DIAGNOSIS — Z96643 Presence of artificial hip joint, bilateral: Secondary | ICD-10-CM | POA: Diagnosis not present

## 2019-08-13 LAB — CBC
HCT: 40.6 % (ref 36.0–46.0)
Hemoglobin: 13.4 g/dL (ref 12.0–15.0)
MCH: 26.8 pg (ref 26.0–34.0)
MCHC: 33 g/dL (ref 30.0–36.0)
MCV: 81.2 fL (ref 80.0–100.0)
Platelets: 284 10*3/uL (ref 150–400)
RBC: 5 MIL/uL (ref 3.87–5.11)
RDW: 14.1 % (ref 11.5–15.5)
WBC: 4.3 10*3/uL (ref 4.0–10.5)
nRBC: 0 % (ref 0.0–0.2)

## 2019-08-13 LAB — BASIC METABOLIC PANEL
Anion gap: 13 (ref 5–15)
BUN: 14 mg/dL (ref 8–23)
CO2: 26 mmol/L (ref 22–32)
Calcium: 8.8 mg/dL — ABNORMAL LOW (ref 8.9–10.3)
Chloride: 97 mmol/L — ABNORMAL LOW (ref 98–111)
Creatinine, Ser: 1.13 mg/dL — ABNORMAL HIGH (ref 0.44–1.00)
GFR calc Af Amer: 59 mL/min — ABNORMAL LOW (ref >60)
GFR calc non Af Amer: 51 mL/min — ABNORMAL LOW (ref >60)
Glucose, Bld: 185 mg/dL — ABNORMAL HIGH (ref 70–99)
Potassium: 2.8 mmol/L — ABNORMAL LOW (ref 3.5–5.1)
Sodium: 136 mmol/L (ref 135–145)

## 2019-08-13 NOTE — ED Triage Notes (Signed)
Patient reports weak feeling since yesterday.  Reports almost fell earlier today.

## 2019-08-14 ENCOUNTER — Emergency Department
Admission: EM | Admit: 2019-08-14 | Discharge: 2019-08-14 | Disposition: A | Discharge status: Home / Self Care | Payer: Medicare HMO | Attending: Student | Admitting: Student

## 2019-08-14 DIAGNOSIS — R5383 Other fatigue: Secondary | ICD-10-CM

## 2019-08-14 DIAGNOSIS — E876 Hypokalemia: Secondary | ICD-10-CM

## 2019-08-14 DIAGNOSIS — G4700 Insomnia, unspecified: Secondary | ICD-10-CM | POA: Diagnosis not present

## 2019-08-14 DIAGNOSIS — E860 Dehydration: Secondary | ICD-10-CM

## 2019-08-14 LAB — URINALYSIS, COMPLETE (UACMP) WITH MICROSCOPIC
Bilirubin Urine: NEGATIVE
Glucose, UA: NEGATIVE mg/dL
Hgb urine dipstick: NEGATIVE
Ketones, ur: 5 mg/dL — AB
Leukocytes,Ua: NEGATIVE
Nitrite: NEGATIVE
Protein, ur: NEGATIVE mg/dL
Specific Gravity, Urine: 1.017 (ref 1.005–1.030)
pH: 5 (ref 5.0–8.0)

## 2019-08-14 LAB — MAGNESIUM: Magnesium: 2 mg/dL (ref 1.7–2.4)

## 2019-08-14 MED ORDER — POTASSIUM CHLORIDE 10 MEQ/100ML IV SOLN
10.0000 meq | Freq: Once | INTRAVENOUS | Status: AC
  Administered 2019-08-14: 10 meq via INTRAVENOUS
  Filled 2019-08-14: qty 100

## 2019-08-14 MED ORDER — INSULIN ASPART 100 UNIT/ML ~~LOC~~ SOLN
SUBCUTANEOUS | Status: AC
  Filled 2019-08-14: qty 1

## 2019-08-14 MED ORDER — SODIUM CHLORIDE 0.9 % IV BOLUS
1000.0000 mL | Freq: Once | INTRAVENOUS | Status: AC
  Administered 2019-08-14: 06:00:00 1000 mL via INTRAVENOUS

## 2019-08-14 MED ORDER — POTASSIUM CHLORIDE CRYS ER 20 MEQ PO TBCR
40.0000 meq | EXTENDED_RELEASE_TABLET | Freq: Once | ORAL | Status: AC
  Administered 2019-08-14: 40 meq via ORAL
  Filled 2019-08-14: qty 2

## 2019-08-14 NOTE — ED Notes (Signed)
Pt ambulated to the restroom without assistance

## 2019-08-14 NOTE — ED Provider Notes (Signed)
Patient completed fluids, potassium repletion.  She has ambulated and tolerated PO food and fluids without difficulty.  Urine without evidence of infection.  As such, will proceed with discharge as planned.  Advised PCP follow-up and given return precautions.   Lilia Pro., MD 08/14/19 [phone]

## 2019-08-14 NOTE — ED Notes (Signed)
Report received from April, RN. Pt is resting comfortably in ED stretcher. ED stretcher locked and in lowest position. Introduced self to pt. Pt denies pain and no complaints at this time.  

## 2019-08-14 NOTE — ED Notes (Signed)
Report to ashley and jennifer, rn.  
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
## 2019-08-14 NOTE — ED Notes (Signed)
Pt tolerating PO graham crackers and ginger ale.

## 2019-08-14 NOTE — Discharge Instructions (Addendum)
Thank you for letting us take care of you in the emergency department today.  ° °Please continue to take any regular, prescribed medications.  ° °Please follow up with: °- Your primary care doctor to review your ER visit and follow up on your symptoms.  ° °Please return to the ER for any new or worsening symptoms.  ° °

## 2020-05-01 ENCOUNTER — Other Ambulatory Visit: Payer: Self-pay | Admitting: Physician Assistant

## 2020-05-01 DIAGNOSIS — Z13820 Encounter for screening for osteoporosis: Secondary | ICD-10-CM

## 2020-05-01 DIAGNOSIS — Z1231 Encounter for screening mammogram for malignant neoplasm of breast: Secondary | ICD-10-CM

## 2020-05-23 ENCOUNTER — Other Ambulatory Visit: Payer: Self-pay | Admitting: Physician Assistant

## 2020-05-23 DIAGNOSIS — G3184 Mild cognitive impairment, so stated: Secondary | ICD-10-CM

## 2020-06-16 ENCOUNTER — Ambulatory Visit: Payer: Medicare Other

## 2020-06-17 ENCOUNTER — Ambulatory Visit: Payer: Medicare Other

## 2020-07-01 ENCOUNTER — Ambulatory Visit: Payer: Medicare Other

## 2020-07-18 ENCOUNTER — Ambulatory Visit
Admission: RE | Admit: 2020-07-18 | Discharge: 2020-07-18 | Disposition: A | Discharge status: Home / Self Care | Payer: Medicare Other | Source: Ambulatory Visit | Attending: Physician Assistant | Admitting: Physician Assistant

## 2020-07-18 ENCOUNTER — Other Ambulatory Visit: Payer: Self-pay

## 2020-07-18 DIAGNOSIS — G3184 Mild cognitive impairment, so stated: Secondary | ICD-10-CM | POA: Diagnosis not present

## 2021-08-20 ENCOUNTER — Other Ambulatory Visit: Payer: Self-pay | Admitting: Student

## 2021-08-20 DIAGNOSIS — Z13820 Encounter for screening for osteoporosis: Secondary | ICD-10-CM

## 2021-08-20 DIAGNOSIS — Z1231 Encounter for screening mammogram for malignant neoplasm of breast: Secondary | ICD-10-CM

## 2021-10-03 ENCOUNTER — Emergency Department: Payer: Medicare PPO

## 2021-10-03 ENCOUNTER — Emergency Department
Admission: EM | Admit: 2021-10-03 | Discharge: 2021-10-04 | Disposition: A | Discharge status: Home / Self Care | Payer: Medicare PPO | Attending: Emergency Medicine | Admitting: Emergency Medicine

## 2021-10-03 DIAGNOSIS — Z20822 Contact with and (suspected) exposure to covid-19: Secondary | ICD-10-CM | POA: Diagnosis not present

## 2021-10-03 DIAGNOSIS — R55 Syncope and collapse: Secondary | ICD-10-CM | POA: Diagnosis not present

## 2021-10-03 DIAGNOSIS — I10 Essential (primary) hypertension: Secondary | ICD-10-CM | POA: Diagnosis not present

## 2021-10-03 DIAGNOSIS — E876 Hypokalemia: Secondary | ICD-10-CM

## 2021-10-03 LAB — COMPREHENSIVE METABOLIC PANEL
ALT: 10 U/L (ref 0–44)
AST: 13 U/L — ABNORMAL LOW (ref 15–41)
Albumin: 3.9 g/dL (ref 3.5–5.0)
Alkaline Phosphatase: 55 U/L (ref 38–126)
Anion gap: 9 (ref 5–15)
BUN: 26 mg/dL — ABNORMAL HIGH (ref 8–23)
CO2: 28 mmol/L (ref 22–32)
Calcium: 8.4 mg/dL — ABNORMAL LOW (ref 8.9–10.3)
Chloride: 101 mmol/L (ref 98–111)
Creatinine, Ser: 1.1 mg/dL — ABNORMAL HIGH (ref 0.44–1.00)
GFR, Estimated: 55 mL/min — ABNORMAL LOW (ref >60)
Glucose, Bld: 103 mg/dL — ABNORMAL HIGH (ref 70–99)
Potassium: 2.8 mmol/L — ABNORMAL LOW (ref 3.5–5.1)
Sodium: 138 mmol/L (ref 135–145)
Total Bilirubin: 0.7 mg/dL (ref 0.3–1.2)
Total Protein: 6.8 g/dL (ref 6.5–8.1)

## 2021-10-03 LAB — CBC WITH DIFFERENTIAL/PLATELET
Abs Immature Granulocytes: 0.05 10*3/uL (ref 0.00–0.07)
Basophils Absolute: 0 10*3/uL (ref 0.0–0.1)
Basophils Relative: 0 %
Eosinophils Absolute: 0.1 10*3/uL (ref 0.0–0.5)
Eosinophils Relative: 1 %
HCT: 34.3 % — ABNORMAL LOW (ref 36.0–46.0)
Hemoglobin: 11.3 g/dL — ABNORMAL LOW (ref 12.0–15.0)
Immature Granulocytes: 1 %
Lymphocytes Relative: 34 %
Lymphs Abs: 2.8 10*3/uL (ref 0.7–4.0)
MCH: 27.1 pg (ref 26.0–34.0)
MCHC: 32.9 g/dL (ref 30.0–36.0)
MCV: 82.3 fL (ref 80.0–100.0)
Monocytes Absolute: 0.9 10*3/uL (ref 0.1–1.0)
Monocytes Relative: 11 %
Neutro Abs: 4.4 10*3/uL (ref 1.7–7.7)
Neutrophils Relative %: 53 %
Platelets: 227 10*3/uL (ref 150–400)
RBC: 4.17 MIL/uL (ref 3.87–5.11)
RDW: 13.5 % (ref 11.5–15.5)
Smear Review: NORMAL
WBC: 8.3 10*3/uL (ref 4.0–10.5)
nRBC: 0 % (ref 0.0–0.2)

## 2021-10-03 LAB — TROPONIN I (HIGH SENSITIVITY)
Troponin I (High Sensitivity): 4 ng/L (ref <18)
Troponin I (High Sensitivity): 4 ng/L (ref <18)

## 2021-10-03 LAB — MAGNESIUM: Magnesium: 1.6 mg/dL — ABNORMAL LOW (ref 1.7–2.4)

## 2021-10-03 MED ORDER — SODIUM CHLORIDE 0.9 % IV BOLUS
1000.0000 mL | Freq: Once | INTRAVENOUS | Status: AC
  Administered 2021-10-03: 22:00:00 1000 mL via INTRAVENOUS

## 2021-10-03 MED ORDER — POTASSIUM CHLORIDE CRYS ER 20 MEQ PO TBCR: 20.0000 meq | EXTENDED_RELEASE_TABLET | Freq: Every day | ORAL | 0 refills | Status: AC

## 2021-10-03 MED ORDER — POTASSIUM CHLORIDE CRYS ER 20 MEQ PO TBCR
40.0000 meq | EXTENDED_RELEASE_TABLET | Freq: Once | ORAL | Status: AC
  Administered 2021-10-04: 40 meq via ORAL
  Filled 2021-10-03: qty 2

## 2021-10-03 MED ORDER — POTASSIUM CHLORIDE 10 MEQ/100ML IV SOLN
10.0000 meq | INTRAVENOUS | Status: AC
  Administered 2021-10-04 (×2): 10 meq via INTRAVENOUS
  Filled 2021-10-03: qty 100

## 2021-10-03 MED ORDER — SODIUM CHLORIDE 0.9 % IV BOLUS: 500.0000 mL | Freq: Once | INTRAVENOUS | Status: DC

## 2021-10-03 NOTE — ED Provider Notes (Addendum)
[facility] Provider Note    Event Date/Time   First MD Initiated Contact with Patient 10/03/21 2048     (approximate)   History   Loss of Consciousness   HPI  Abigail Tran is a 71 y.o. female with history of mild cognitive impairment, hypertension who comes in with concerns for passing out.  Patient reports that she was at home washing dishes when she suddenly became weak all over and she went to try to sit down and her whole body just gave out and she fell onto the floor.  Patient states that she did not fully lose consciousness contrary to triage note.  She states that she did not hit her head denies any headaches.  She denies any chest pain, shortness of breath.  Patient reports that she feels at her baseline self.  She reports some right foot pain but otherwise no other symptoms.  Patient was orthostatic with EMS and got 400 mL of normal saline.  Patient does report that she normally takes 3 blood pressure medications at night just prior to bed and and goes to bed.  However tonight she took them a little early and she had not eaten any food and she was trying to cook when this happened about 30 minutes after taking the medications.      Physical Exam   Triage Vital Signs: ED Triage Vitals  Enc Vitals Group     BP 10/03/21 2048 (!) 130/53     Pulse Rate 10/03/21 2048 64     Resp 10/03/21 2048 19     Temp 10/03/21 2048 98.4 F (36.9 C)     Temp Source 10/03/21 2048 Oral     SpO2 10/03/21 2048 98 %     Weight 10/03/21 2047 200 lb (90.7 kg)     Height --      Head Circumference --      Peak Flow --      Pain Score --      Pain Loc --      Pain Edu? --      Excl. in [place]? --     Most recent vital signs: Vitals:   10/03/21 2048  BP: (!) 130/53  Pulse: 64  Resp: 19  Temp: 98.4 F (36.9 C)  SpO2: 98%     General: Awake, no distress.  Well-appearing CV:  Good peripheral perfusion. Resp:  Normal effort.   Abd:  No distention.   Nontender Other:  Patient has equal strength in arms and legs.  No tenderness noted except for the right pinky toe.  No abrasions noted.  She has no evidence of hematoma on her head.  No evidence of battle sign or any bruising.   ED Results / Procedures / Treatments   Labs (all labs ordered are listed, but only abnormal results are displayed) Labs Reviewed  CBC WITH DIFFERENTIAL/PLATELET - Abnormal; Notable for the following components:      Result Value   Hemoglobin 11.3 (*)    HCT 34.3 (*)    All other components within normal limits  RESP PANEL BY RT-PCR (FLU A&B, COVID) ARPGX2  COMPREHENSIVE METABOLIC PANEL  URINALYSIS, ROUTINE W REFLEX MICROSCOPIC  TROPONIN I (HIGH SENSITIVITY)     EKG  My interpretation of EKG:  Normal sinus rate of 60 without any ST elevation or T wave inversions, normal intervals  RADIOLOGY I have reviewed the xray personally and agree with radiology read no fracture    PROCEDURES:  Critical Care performed: No  .1-3 Lead EKG Interpretation Performed by: Concha Se, MD Authorized by: Concha Se, MD     Interpretation: normal     ECG rate:  60   ECG rate assessment: normal     Rhythm: sinus rhythm     Ectopy: none     Conduction: normal     MEDICATIONS ORDERED IN ED: Medications  sodium chloride 0.9 % bolus 1,000 mL (1,000 mLs Intravenous New Bag/Given 10/03/21 2130)     IMPRESSION / MDM / ASSESSMENT AND PLAN / ED COURSE  I reviewed the triage vital signs and the nursing notes.  Differential diagnosis includes, but is not limited to, dehydration, electrolyte abnormalities, AKI, ACS, arrhythmia.  However given patient's report of taking her blood pressure medicine 30 minutes prior to this happening on an empty stomach I suspect that it could be related to her blood pressure medications causing her to be hypotensive.  We will give patient 1 L of fluid given no history of heart failure and positive orthostatics with EMS.  She was  complaining of some toe pain so we will get x-ray to evaluate for any fracture  CBC hemoglobin at baseline troponin is negative Initial CMP hemolyzed. CMP shows kidney function at baseline, magnesium slightly low so we will give some IV repletion.  Her potassium is also slightly low getting some IV repletion  Patient handoff to oncoming team pending repeat troponin.  Suspect patient will get discharged and she consider holding her amlodipine until she gets a blood pressure recheck with her primary care doctor.  Patient handed off pending ambulation trial and electrolyte repletion but suspect discharge  The patient is on the cardiac monitor to evaluate for evidence of arrhythmia and/or significant heart rate changes.   FINAL CLINICAL IMPRESSION(S) / ED DIAGNOSES   Final diagnoses:  Near syncope     Rx / DC Orders   ED Discharge Orders     None        Note:  This document was prepared using Dragon voice recognition software and may include unintentional dictation errors.   Concha Se, MD 10/03/21 2234    Concha Se, MD 10/04/21 [phone]

## 2021-10-03 NOTE — Discharge Instructions (Addendum)
Your blood pressure was low today I wonder if it was from your blood pressure medication.  Please take them before you go to bed.  I would consider holding one of your blood pressure medicine like your amlodipine and try to take your blood pressure once in the morning and once at night.  Then follow-up for a blood pressure check in 2 days.  If they are still running elevated then you may need to have it put back on but I do not want you to have a low blood pressure.  Your potassium and mag was also low we gave you some repletion here you to follow-up for a recheck.

## 2021-10-03 NOTE — ED Triage Notes (Signed)
Per EMS pt was at home washing dishes when she suddenly became dizzy and attempted to sit down and passed out. Per EMS pt was orthostatic pos at her house. Pt received 456ml of NS enroute to ED. Pt sts that she is feeling better.

## 2021-10-04 LAB — RESP PANEL BY RT-PCR (FLU A&B, COVID) ARPGX2
Influenza A by PCR: NEGATIVE
Influenza B by PCR: NEGATIVE
SARS Coronavirus 2 by RT PCR: NEGATIVE

## 2021-10-04 MED ORDER — MAGNESIUM SULFATE 2 GM/50ML IV SOLN
2.0000 g | Freq: Once | INTRAVENOUS | Status: AC
  Administered 2021-10-04: 2 g via INTRAVENOUS
  Filled 2021-10-04: qty 50

## 2021-10-04 NOTE — ED Provider Notes (Signed)
Vitals:   10/03/21 2245 10/04/21 0204  BP:  132/63  Pulse: 63 96  Resp: 16 17  Temp:  98 F (36.7 C)  SpO2: 99% 95%    Patient is resting comfortably, has been able to ambulate back and forth in the ER back and forth to the bathroom and feels much better.  No longer having any feelings of lightheadedness.  She feels comfortable with the plan for discharge, discussed with her and also with her family and patient has a tendency to take all of her antihypertensive at 1 time they reports she is supposed to space them out during the day.  We discussed this, also discussed follow-up with primary care physician and careful return precautions  Return precautions and treatment recommendations and follow-up discussed with the patient who is agreeable with the plan.  Patient fully alert well oriented no distress asymptomatic at this time.  Appears stable and well for discharge   Sharyn Creamer, MD 10/04/21 [id number]

## 2021-10-04 NOTE — ED Notes (Signed)
Pt ambulated with steady gait, no assistance needed, no difficulty reported

## 2023-04-09 ENCOUNTER — Other Ambulatory Visit: Payer: Self-pay | Admitting: Family Medicine

## 2023-04-09 DIAGNOSIS — R413 Other amnesia: Secondary | ICD-10-CM

## 2023-04-14 ENCOUNTER — Ambulatory Visit
Admission: RE | Admit: 2023-04-14 | Discharge: 2023-04-14 | Disposition: A | Discharge status: Home / Self Care | Payer: Medicare HMO | Source: Ambulatory Visit | Attending: Family Medicine | Admitting: Family Medicine

## 2023-04-14 DIAGNOSIS — R413 Other amnesia: Secondary | ICD-10-CM | POA: Insufficient documentation

## 2023-08-20 ENCOUNTER — Other Ambulatory Visit: Payer: Self-pay | Admitting: Nurse Practitioner

## 2023-08-20 DIAGNOSIS — Z1231 Encounter for screening mammogram for malignant neoplasm of breast: Secondary | ICD-10-CM

## 2023-08-20 DIAGNOSIS — Z780 Asymptomatic menopausal state: Secondary | ICD-10-CM

## 2023-11-14 ENCOUNTER — Telehealth: Payer: Self-pay

## 2023-11-14 NOTE — Telephone Encounter (Signed)
 Copied from CRM [id number]. Topic: Medical Record Request - Records Request >> Nov 13, 2023  3:09 PM Higinio Roger wrote: Reason for CRM: [name] from [facility] would like a callback to discuss getting the home health orders signed by Dr. Girtha Rm or her  nurse. Callback #: [phone]

## 2023-11-14 NOTE — Telephone Encounter (Signed)
 Left detailed VM message informing [name] that Dr. Girtha Rm is unable to sign those orders because she is not patient's PCP nor has patient established care at this practice.

## 2023-11-17 ENCOUNTER — Telehealth: Payer: Self-pay | Admitting: Family Medicine

## 2023-11-17 NOTE — Telephone Encounter (Signed)
 Rep returning Tara's call re: Dr. Girtha Rm not being able to sign orders for unestablished patient. Kathlee Nations that Dr. Girtha Rm could not sign old orders from her previous practice without having established care with the patient at current location. Verline Lema of website to access [facility] medical records.

## 2023-11-17 NOTE — Telephone Encounter (Signed)
 [name] calling back. Transferred to [facility]

## 2024-02-18 ENCOUNTER — Other Ambulatory Visit

## 2024-02-18 ENCOUNTER — Encounter

## 2024-03-02 ENCOUNTER — Encounter: Payer: Self-pay | Admitting: *Deleted

## 2024-03-03 ENCOUNTER — Encounter: Payer: Self-pay | Admitting: Internal Medicine

## 2024-03-03 ENCOUNTER — Ambulatory Visit: Attending: Internal Medicine | Admitting: Internal Medicine

## 2024-03-03 VITALS — BP 164/86 | HR 63 | Ht 66.0 in | Wt 206.8 lb

## 2024-03-03 DIAGNOSIS — R600 Localized edema: Secondary | ICD-10-CM | POA: Diagnosis not present

## 2024-03-03 DIAGNOSIS — R7303 Prediabetes: Secondary | ICD-10-CM | POA: Diagnosis not present

## 2024-03-03 DIAGNOSIS — I10 Essential (primary) hypertension: Secondary | ICD-10-CM | POA: Insufficient documentation

## 2024-03-03 DIAGNOSIS — Z79899 Other long term (current) drug therapy: Secondary | ICD-10-CM

## 2024-03-03 DIAGNOSIS — Z8673 Personal history of transient ischemic attack (TIA), and cerebral infarction without residual deficits: Secondary | ICD-10-CM | POA: Insufficient documentation

## 2024-03-03 MED ORDER — AMLODIPINE BESYLATE 5 MG PO TABS: 5.0000 mg | ORAL_TABLET | Freq: Every evening | ORAL | 2 refills | Status: AC

## 2024-03-03 MED ORDER — HYDROCHLOROTHIAZIDE 12.5 MG PO CAPS
12.5000 mg | ORAL_CAPSULE | Freq: Every day | ORAL | 3 refills | Status: AC
Start: 2024-03-03 — End: 2024-06-01

## 2024-03-03 NOTE — Patient Instructions (Signed)
 Medication Instructions:  Your physician recommends the following medication changes.  DECREASE: Amlodipine  to 5 mg by mouth daily  START TAKING: Hydrochlorothiazide 12.5 mg by mouth daily    *If you need a refill on your cardiac medications before your next appointment, please call your pharmacy*  Lab Work: Your provider would like for you to have following labs drawn today (CMP, CBC, Lipid).     Testing/Procedures: No test ordered today   Follow-Up: At [facility], you and your health needs are our priority.  As part of our continuing mission to provide you with exceptional heart care, our providers are all part of one team.  This team includes your primary Cardiologist (physician) and Advanced Practice Providers or APPs (Physician Assistants and Nurse Practitioners) who all work together to provide you with the care you need, when you need it.  Your next appointment:   1 month(s)  Provider:   You will see one of the following Advanced Practice Providers on your designated Care Team:   Lonni Meager, NP Lesley Maffucci, PA-C Bernardino Bring, PA-C Cadence [place], PA-C Tylene Lunch, NP Barnie Hila, NP

## 2024-03-03 NOTE — Progress Notes (Signed)
 Cardiology Office Note:  .   Date:  03/03/2024  ID:  Abigail Tran, DOB Mar 07, 1951, MRN [id number] PCP: Center, [facility] Providers Cardiologist:  New Click to update primary MD,subspecialty MD or APP then REFRESH:1}    History of Present Illness: .   LAM Abigail Tran is a 73 y.o. female with history of hypertension, borderline diabetes mellitus, stroke, and arthritis, who has been referred by Mr. Abigail Tran for evaluation of hypertension.  Patient does not know why she is here today, though her caregiver, who accompanies her, notes that there has been some concern for intermittent lower extremity swelling.  This began a little over a month ago and has been off and on.  It typically resolves with leg elevation.  On further questioning, swelling began after she was started on amlodipine  10 mg daily by her PCPs office.  Abigail Tran otherwise reports feeling well, denying chest pain, shortness of breath, palpitations, and lightheadedness.  She does not check her blood pressure at home.  She is fairly sedentary.  She avoids salt because it makes her nauseated.  ROS: See HPI  Studies Reviewed: SABRA   EKG Interpretation Date/Time:  Wednesday March 03 2024 10:05:37 EDT Ventricular Rate:  63 PR Interval:  196 QRS Duration:  100 QT Interval:  426 QTC Calculation: 435 R Axis:   -15  Text Interpretation: Normal sinus rhythm Minimal voltage criteria for LVH, may be normal variant ( Cornell product ) When compared with ECG of 03-Oct-2021 21:00, Possible Anterior infarct is no longer Present Confirmed by Abigail Tran, Abigail [phone]) on 03/03/2024 10:30:55 AM    Risk Assessment/Calculations:     HYPERTENSION CONTROL Vitals:   03/03/24 1001 03/03/24 1002 03/03/24 1020  BP: (!) 150/80 (!) 150/80 (!) 164/86    The patient's blood pressure is elevated above target today.  In order to address the patient's elevated BP: A new medication was prescribed today.           Physical Exam:   VS:  BP (!) 164/86 (BP Location: Left Arm, Cuff Size: Large)   Pulse 63   Ht 5' 6 (1.676 m)   Wt 206 lb 12.8 oz (93.8 kg)   SpO2 96%   BMI 33.38 kg/m    Wt Readings from Last 3 Encounters:  03/03/24 206 lb 12.8 oz (93.8 kg)  10/03/21 200 lb (90.7 kg)  08/13/19 200 lb (90.7 kg)    General:  NAD. Neck: No JVD or HJR. Lungs: Clear to auscultation bilaterally without wheezes or crackles. Heart: Regular rate and rhythm without murmurs, rubs, or gallops. Abdomen: Soft, nontender, nondistended. Extremities: Trace ankle edema bilaterally.  ASSESSMENT AND PLAN: .    Lower extremity edema: I suspect this is related to recent addition of high-dose amlodipine .  We will reduce amlodipine  to 5 mg daily and initiate HCTZ 12.5 mg daily for improved blood pressure control and gentle diuresis.  I will check a CBC and CMP today.  If lower extremity edema worsens or the patient develops other signs/symptoms of heart failure, we will need to consider obtaining an echocardiogram.  Hypertension: Blood pressure suboptimally controlled today.  As above, we will decrease amlodipine  to 5 mg daily and add HCTZ 12.5 mg daily.  Continue carvedilol .  It appears that the patient was on lisinopril  at 1 point though this was discontinued for unclear reasons.  If blood pressure remains suboptimally controlled with escalation of HCTZ, rechallenge with ARB could be considered.  Borderline diabetes mellitus and  history of stroke: Check lipids today as the patient is not currently on a statin.  Continue aspirin .  Anticipate adding statin therapy if LDL not at goal (less than 70).  Continue management of hyperglycemia per PCP.    Dispo: Return to clinic in 1 month with APP.  BMP should be repeated at that time.  Signed, Abigail Hanson, MD

## 2024-03-04 LAB — COMPREHENSIVE METABOLIC PANEL WITH GFR
ALT: 11 IU/L (ref 0–32)
AST: 15 IU/L (ref 0–40)
Albumin: 4.2 g/dL (ref 3.8–4.8)
Alkaline Phosphatase: 81 IU/L (ref 44–121)
BUN/Creatinine Ratio: 9 — ABNORMAL LOW (ref 12–28)
BUN: 7 mg/dL — ABNORMAL LOW (ref 8–27)
Bilirubin Total: 0.4 mg/dL (ref 0.0–1.2)
CO2: 23 mmol/L (ref 20–29)
Calcium: 9.3 mg/dL (ref 8.7–10.3)
Chloride: 105 mmol/L (ref 96–106)
Creatinine, Ser: 0.77 mg/dL (ref 0.57–1.00)
Globulin, Total: 2.2 g/dL (ref 1.5–4.5)
Glucose: 150 mg/dL — ABNORMAL HIGH (ref 70–99)
Potassium: 3.6 mmol/L (ref 3.5–5.2)
Sodium: 146 mmol/L — ABNORMAL HIGH (ref 134–144)
Total Protein: 6.4 g/dL (ref 6.0–8.5)
eGFR: 82 mL/min/{1.73_m2} (ref >59)

## 2024-03-04 LAB — CBC
Hematocrit: 39.9 % (ref 34.0–46.6)
Hemoglobin: 12.9 g/dL (ref 11.1–15.9)
MCH: 26.7 pg (ref 26.6–33.0)
MCHC: 32.3 g/dL (ref 31.5–35.7)
MCV: 83 fL (ref 79–97)
Platelets: 290 10*3/uL (ref 150–450)
RBC: 4.83 x10E6/uL (ref 3.77–5.28)
RDW: 13.9 % (ref 11.7–15.4)
WBC: 6.1 10*3/uL (ref 3.4–10.8)

## 2024-03-04 LAB — LIPID PANEL
Chol/HDL Ratio: 2.4 ratio (ref 0.0–4.4)
Cholesterol, Total: 143 mg/dL (ref 100–199)
HDL: 59 mg/dL (ref >39)
LDL Chol Calc (NIH): 71 mg/dL (ref 0–99)
Triglycerides: 62 mg/dL (ref 0–149)
VLDL Cholesterol Cal: 13 mg/dL (ref 5–40)

## 2024-03-07 ENCOUNTER — Ambulatory Visit: Payer: Self-pay | Admitting: Internal Medicine

## 2024-03-07 DIAGNOSIS — Z79899 Other long term (current) drug therapy: Secondary | ICD-10-CM

## 2024-03-08 MED ORDER — ROSUVASTATIN CALCIUM 5 MG PO TABS: 5.0000 mg | ORAL_TABLET | Freq: Every day | ORAL | 3 refills | Status: AC
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
## 2024-04-05 ENCOUNTER — Ambulatory Visit: Attending: Physician Assistant | Admitting: Physician Assistant

## 2024-06-10 ENCOUNTER — Emergency Department
Admission: EM | Admit: 2024-06-10 | Discharge: 2024-06-10 | Disposition: A | Discharge status: Home / Self Care | Attending: Emergency Medicine | Admitting: Emergency Medicine

## 2024-06-10 ENCOUNTER — Other Ambulatory Visit: Payer: Self-pay

## 2024-06-10 DIAGNOSIS — R638 Other symptoms and signs concerning food and fluid intake: Secondary | ICD-10-CM | POA: Diagnosis present

## 2024-06-10 DIAGNOSIS — F0390 Unspecified dementia without behavioral disturbance: Secondary | ICD-10-CM | POA: Diagnosis not present

## 2024-06-10 DIAGNOSIS — R630 Anorexia: Secondary | ICD-10-CM

## 2024-06-10 DIAGNOSIS — R3912 Poor urinary stream: Secondary | ICD-10-CM | POA: Diagnosis not present

## 2024-06-10 HISTORY — DX: Type 2 diabetes mellitus without complications: E11.9

## 2024-06-10 LAB — BASIC METABOLIC PANEL WITH GFR
Anion gap: 13 (ref 5–15)
BUN: 8 mg/dL (ref 8–23)
CO2: 27 mmol/L (ref 22–32)
Calcium: 9.4 mg/dL (ref 8.9–10.3)
Chloride: 101 mmol/L (ref 98–111)
Creatinine, Ser: 0.86 mg/dL (ref 0.44–1.00)
GFR, Estimated: >60 mL/min (ref >60)
Glucose, Bld: 186 mg/dL — ABNORMAL HIGH (ref 70–99)
Potassium: 3 mmol/L — ABNORMAL LOW (ref 3.5–5.1)
Sodium: 141 mmol/L (ref 135–145)

## 2024-06-10 LAB — CBC
HCT: 40 % (ref 36.0–46.0)
Hemoglobin: 13.1 g/dL (ref 12.0–15.0)
MCH: 26.7 pg (ref 26.0–34.0)
MCHC: 32.8 g/dL (ref 30.0–36.0)
MCV: 81.6 fL (ref 80.0–100.0)
Platelets: 312 K/uL (ref 150–400)
RBC: 4.9 MIL/uL (ref 3.87–5.11)
RDW: 13.3 % (ref 11.5–15.5)
WBC: 7.3 K/uL (ref 4.0–10.5)
nRBC: 0 % (ref 0.0–0.2)

## 2024-06-10 MED ORDER — SODIUM CHLORIDE 0.9 % IV BOLUS: 1000.0000 mL | Freq: Once | INTRAVENOUS | Status: DC

## 2024-06-10 MED ORDER — POTASSIUM CHLORIDE 10 MEQ/100ML IV SOLN: 10.0000 meq | INTRAVENOUS | Status: DC

## 2024-06-10 NOTE — ED Provider Notes (Signed)
 [facility] Provider Note    Event Date/Time   First MD Initiated Contact with Patient 06/10/24 1623     (approximate)   History   Dehydration  HPI  Abigail Tran is a 73 y.o. female  who presents to the emergency department today because of concern for dehydration. Patient brought in by daughter. Most history is obtained from daughter at bedside, patient has dementia. For the past three weeks daughter has noticed decreased appetite and fluid intake. Also has noticed decreased urine output today and skin changes. Did also have concern for a possible fall, although patient denies it at the time of my exam and denies any pain. Patient states that she feels fine.     Physical Exam   Triage Vital Signs: ED Triage Vitals  Encounter Vitals Group     BP 06/10/24 1527 (!) 141/109     Girls Systolic BP Percentile --      Girls Diastolic BP Percentile --      Boys Systolic BP Percentile --      Boys Diastolic BP Percentile --      Pulse Rate 06/10/24 1527 73     Resp 06/10/24 1527 18     Temp 06/10/24 1527 97.7 F (36.5 C)     Temp Source 06/10/24 1527 Oral     SpO2 06/10/24 1527 95 %     Weight 06/10/24 1529 185 lb (83.9 kg)     Height 06/10/24 1529 5' 8 (1.727 m)     Head Circumference --      Peak Flow --      Pain Score 06/10/24 1529 0     Pain Loc --      Pain Education --      Exclude from Growth Chart --     Most recent vital signs: Vitals:   06/10/24 1527  BP: (!) 141/109  Pulse: 73  Resp: 18  Temp: 97.7 F (36.5 C)  SpO2: 95%   General: Awake, alert, not completely oriented. CV:  Good peripheral perfusion. Regular rate and rhythm. Resp:  Normal effort. Lungs clear. Abd:  No distention.  Ext:  No extremity deformity or tenderness   ED Results / Procedures / Treatments   Labs (all labs ordered are listed, but only abnormal results are displayed) Labs Reviewed  BASIC METABOLIC PANEL WITH GFR - Abnormal; Notable for the following  components:      Result Value   Potassium 3.0 (*)    Glucose, Bld 186 (*)    All other components within normal limits  CBC  URINALYSIS, ROUTINE W REFLEX MICROSCOPIC     EKG  None   RADIOLOGY None   PROCEDURES:  Critical Care performed: No    MEDICATIONS ORDERED IN ED: Medications - No data to display   IMPRESSION / MDM / ASSESSMENT AND PLAN / ED COURSE  I reviewed the triage vital signs and the nursing notes.                              Differential diagnosis includes, but is not limited to, dehydration, electrolyte abnormality, infection  Patient's presentation is most consistent with acute presentation with potential threat to life or bodily function.   Patient brought to the emergency department by daughter today because of concerns for poor appetite and possible dehydration.  Patient has no complaints at the time my exam.  Prior to patient receiving fluids patient  and daughter state they were requesting discharge.  At this time I think is reasonable to discharge.  Did encourage them to follow-up with primary care.      FINAL CLINICAL IMPRESSION(S) / ED DIAGNOSES   Final diagnoses:  Decreased appetite     Note:  This document was prepared using Dragon voice recognition software and may include unintentional dictation errors.    Floy Roberts, MD 06/10/24 [phone]

## 2024-06-10 NOTE — ED Triage Notes (Signed)
 Pt to ED with daughter for right knee pain, decreased appetite for a few weeks per daughter. Hx dementia. Pt denies complaints.  Daughter concerned about dehydration.  Daughter also wants to speak to social worker about pt's living situation
